# Patient Record
Sex: Male | Born: 1985 | Race: Black or African American | Hispanic: No | Marital: Single | Smoking: Current some day smoker
Health system: Southern US, Community
[De-identification: ages and names within clinical notes are randomized; demographics above are authoritative.]

## PROBLEM LIST (undated history)

## (undated) DIAGNOSIS — K259 Gastric ulcer, unspecified as acute or chronic, without hemorrhage or perforation: Secondary | ICD-10-CM

## (undated) HISTORY — PX: UPPER GI ENDOSCOPY: SHX6162

---
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
## 2009-03-26 ENCOUNTER — Emergency Department: Payer: Self-pay | Admitting: Emergency Medicine

## 2010-07-30 ENCOUNTER — Emergency Department: Payer: Self-pay | Admitting: Emergency Medicine

## 2010-09-02 ENCOUNTER — Emergency Department: Payer: Self-pay | Admitting: Emergency Medicine

## 2010-09-06 ENCOUNTER — Emergency Department: Payer: Self-pay | Admitting: Emergency Medicine

## 2010-09-07 ENCOUNTER — Emergency Department (HOSPITAL_COMMUNITY)
Admission: EM | Admit: 2010-09-07 | Discharge: 2010-09-07 | Disposition: A | Discharge status: Home / Self Care | Payer: Self-pay | Attending: Emergency Medicine | Admitting: Emergency Medicine

## 2010-09-07 ENCOUNTER — Emergency Department (HOSPITAL_COMMUNITY): Payer: Self-pay

## 2010-09-07 DIAGNOSIS — X58XXXA Exposure to other specified factors, initial encounter: Secondary | ICD-10-CM | POA: Insufficient documentation

## 2010-09-07 DIAGNOSIS — K219 Gastro-esophageal reflux disease without esophagitis: Secondary | ICD-10-CM | POA: Insufficient documentation

## 2010-09-07 DIAGNOSIS — T148XXA Other injury of unspecified body region, initial encounter: Secondary | ICD-10-CM | POA: Insufficient documentation

## 2010-09-07 DIAGNOSIS — M549 Dorsalgia, unspecified: Secondary | ICD-10-CM | POA: Insufficient documentation

## 2010-09-07 DIAGNOSIS — R0602 Shortness of breath: Secondary | ICD-10-CM | POA: Insufficient documentation

## 2010-09-07 DIAGNOSIS — R079 Chest pain, unspecified: Secondary | ICD-10-CM | POA: Insufficient documentation

## 2010-10-04 ENCOUNTER — Emergency Department (HOSPITAL_COMMUNITY): Payer: Self-pay

## 2010-10-04 ENCOUNTER — Emergency Department (HOSPITAL_COMMUNITY)
Admission: EM | Admit: 2010-10-04 | Discharge: 2010-10-04 | Disposition: A | Discharge status: Home / Self Care | Payer: Self-pay | Attending: Emergency Medicine | Admitting: Emergency Medicine

## 2010-10-04 ENCOUNTER — Emergency Department: Payer: Self-pay | Admitting: *Deleted

## 2010-10-04 DIAGNOSIS — R079 Chest pain, unspecified: Secondary | ICD-10-CM | POA: Insufficient documentation

## 2010-11-02 ENCOUNTER — Emergency Department: Payer: Self-pay | Admitting: Emergency Medicine

## 2010-11-23 ENCOUNTER — Emergency Department: Payer: Self-pay | Admitting: Emergency Medicine

## 2010-11-24 ENCOUNTER — Emergency Department: Payer: Self-pay | Admitting: Emergency Medicine

## 2011-02-28 ENCOUNTER — Emergency Department: Payer: Self-pay | Admitting: *Deleted

## 2011-09-16 ENCOUNTER — Emergency Department: Payer: Self-pay | Admitting: Emergency Medicine

## 2011-10-02 ENCOUNTER — Emergency Department: Payer: Self-pay | Admitting: Emergency Medicine

## 2011-10-02 LAB — COMPREHENSIVE METABOLIC PANEL
Albumin: 4 g/dL (ref 3.4–5.0)
Alkaline Phosphatase: 111 U/L (ref 50–136)
BUN: 10 mg/dL (ref 7–18)
Bilirubin,Total: 0.5 mg/dL (ref 0.2–1.0)
Calcium, Total: 8.9 mg/dL (ref 8.5–10.1)
Co2: 30 mmol/L (ref 21–32)
Creatinine: 1.11 mg/dL (ref 0.60–1.30)
Glucose: 87 mg/dL (ref 65–99)
Potassium: 3.7 mmol/L (ref 3.5–5.1)
Sodium: 143 mmol/L (ref 136–145)
Total Protein: 7.3 g/dL (ref 6.4–8.2)

## 2011-10-02 LAB — CBC
HCT: 44.3 % (ref 40.0–52.0)
HGB: 15.1 g/dL (ref 13.0–18.0)

## 2012-01-24 ENCOUNTER — Emergency Department: Payer: Self-pay | Admitting: Internal Medicine

## 2012-02-14 ENCOUNTER — Emergency Department: Payer: Self-pay | Admitting: Emergency Medicine

## 2012-03-08 ENCOUNTER — Emergency Department: Payer: Self-pay | Admitting: Emergency Medicine

## 2012-03-11 ENCOUNTER — Emergency Department: Payer: Self-pay | Admitting: Internal Medicine

## 2013-03-08 IMAGING — CR DG CHEST 2V
1 series · 2 of 2 positions shown · non-contrast
Comparison: none

REASON FOR EXAM: chest pain
COMMENTS:

PROCEDURE:     DXR - DXR CHEST PA (OR AP) AND LATERAL  - July 30, 2010  [DATE]
RESULT:     The lungs are clear. The heart and pulmonary vessels are normal.
The bony and mediastinal structures are unremarkable. There is no effusion.
There is no pneumothorax or evidence of congestive failure.

[Series 1: view not recorded · 0.17mm/px · 2 of 2 slices shown]
[im 1/2]
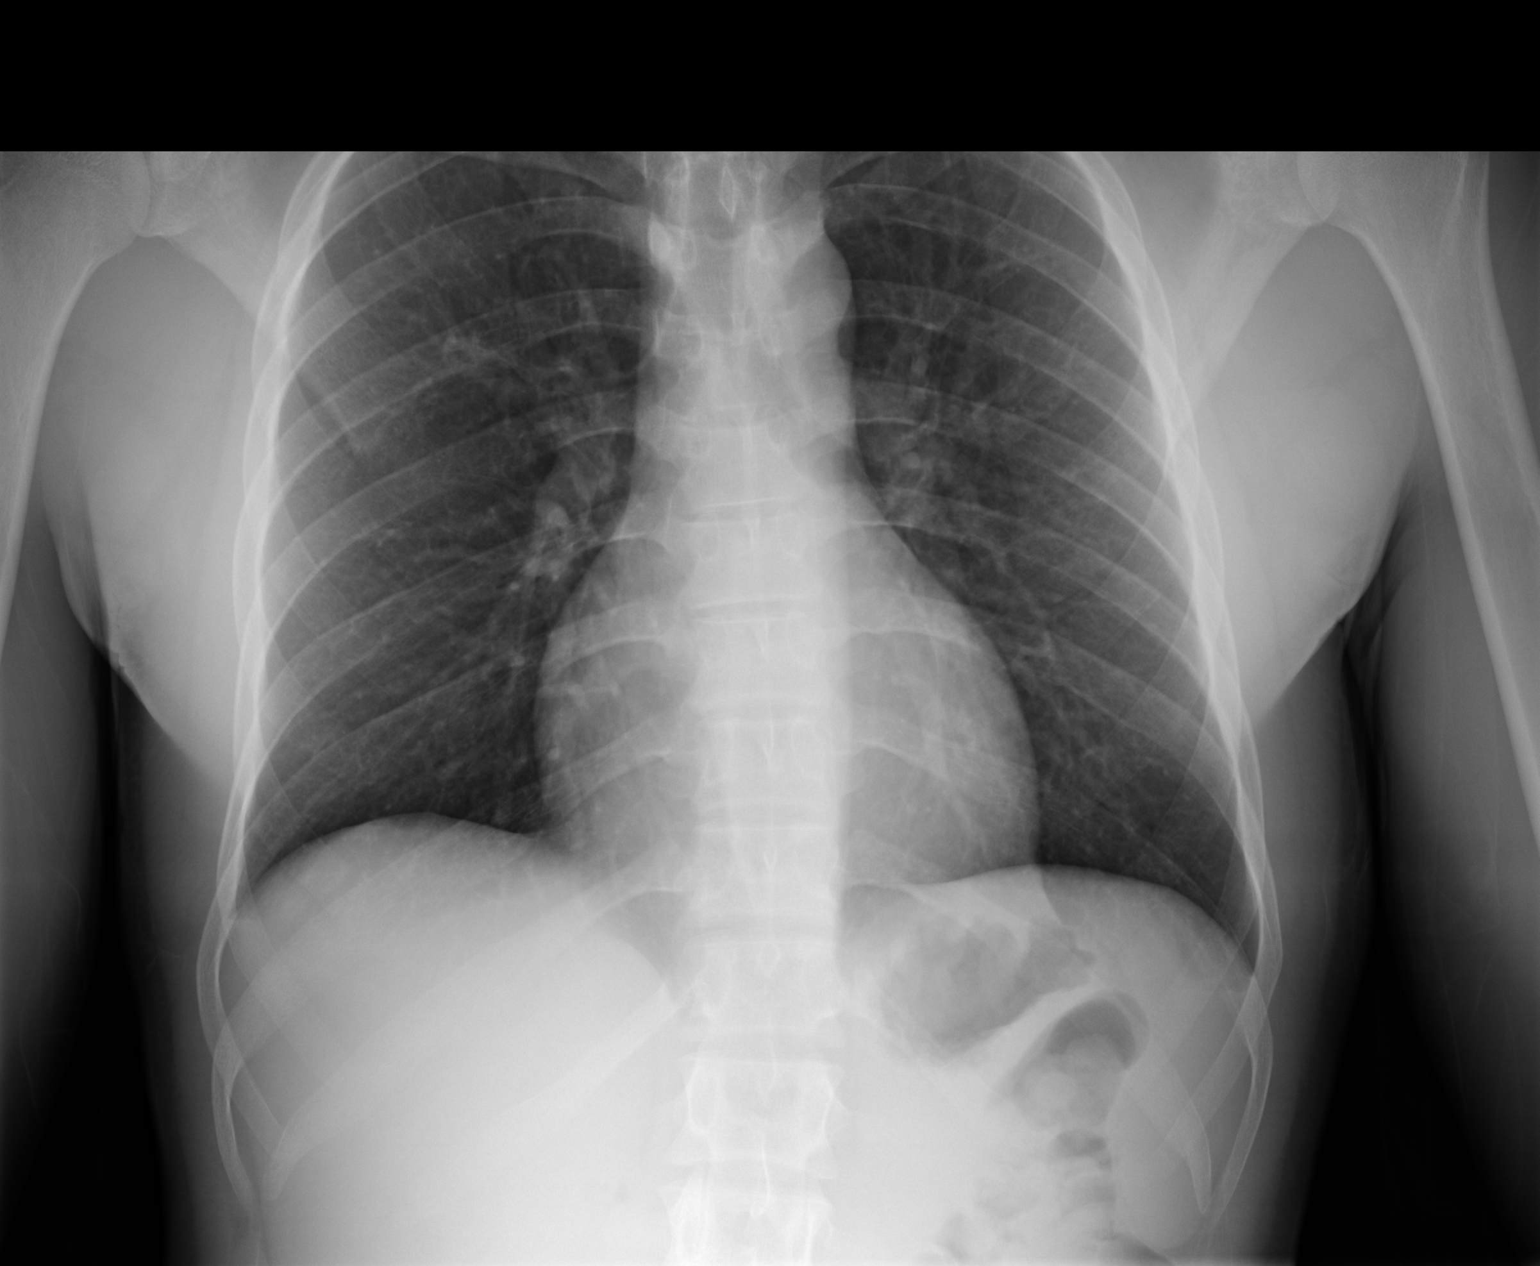
[im 2/2]
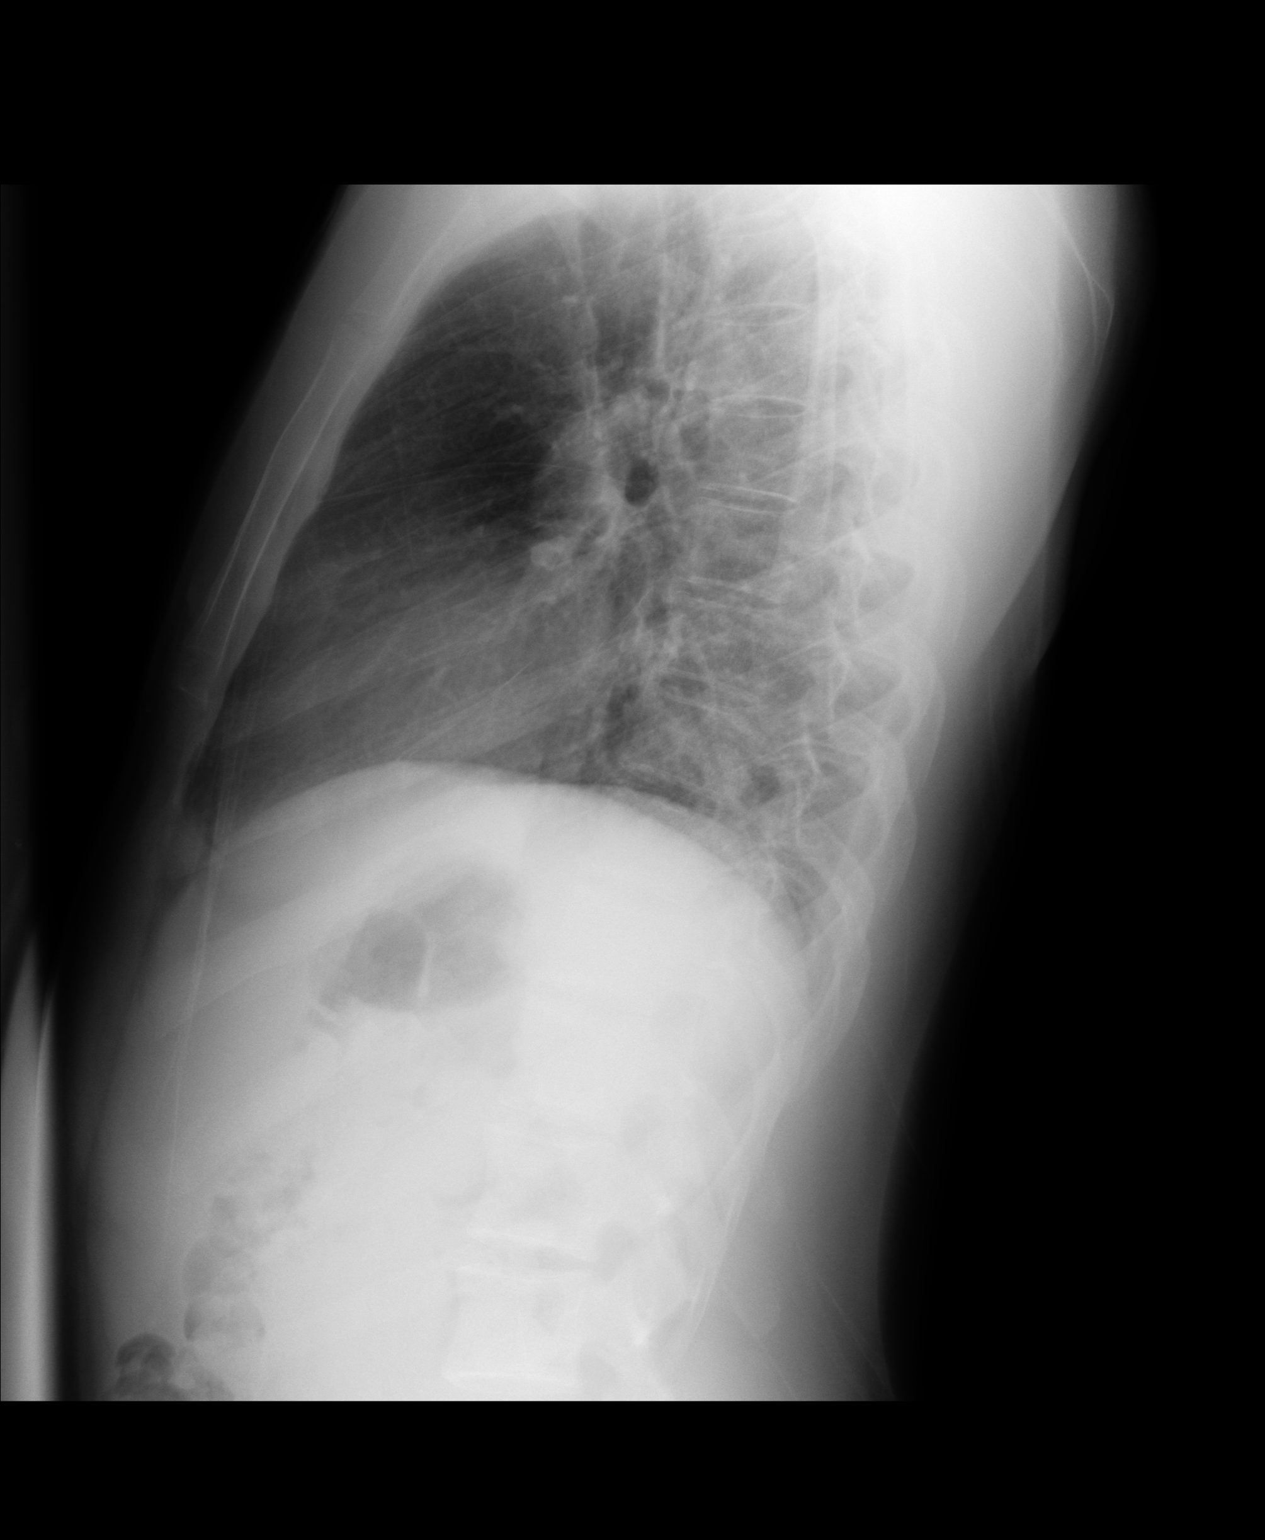

[2 of 2 positions shown; findings below may reference images not displayed]

IMPRESSION: No acute cardiopulmonary disease. Stable appearance.

## 2017-10-11 ENCOUNTER — Emergency Department
Admission: EM | Admit: 2017-10-11 | Discharge: 2017-10-11 | Disposition: A | Discharge status: Home / Self Care | Payer: BLUE CROSS/BLUE SHIELD | Attending: Emergency Medicine | Admitting: Emergency Medicine

## 2017-10-11 ENCOUNTER — Encounter: Payer: Self-pay | Admitting: Emergency Medicine

## 2017-10-11 DIAGNOSIS — L03311 Cellulitis of abdominal wall: Secondary | ICD-10-CM | POA: Insufficient documentation

## 2017-10-11 DIAGNOSIS — R21 Rash and other nonspecific skin eruption: Secondary | ICD-10-CM | POA: Diagnosis present

## 2017-10-11 MED ORDER — SULFAMETHOXAZOLE-TRIMETHOPRIM 800-160 MG PO TABS: 1.0000 | ORAL_TABLET | Freq: Two times a day (BID) | ORAL | 0 refills | Status: AC

## 2017-10-11 MED ORDER — SULFAMETHOXAZOLE-TRIMETHOPRIM 800-160 MG PO TABS
1.0000 | ORAL_TABLET | Freq: Once | ORAL | Status: AC
  Administered 2017-10-11: 1 via ORAL
  Filled 2017-10-11: qty 1

## 2017-10-11 NOTE — ED Notes (Signed)
Pt states that he believes something bit him on Wed evening on his lower part of stomach around his waist line. Pt states that it is painful, itchy and burning.

## 2017-10-11 NOTE — ED Triage Notes (Signed)
Pt arrives with complaints of rash on his lower right abdomen. Pt has two small open areas near belt line.

## 2017-10-11 NOTE — ED Provider Notes (Signed)
[facility] Emergency Department Provider Note  ____________________________________________  Time seen: Approximately 9:37 PM  I have reviewed the triage vital signs and the nursing notes.   HISTORY  Chief Complaint Rash    HPI Johnny MerlesGarrett Mcelmurry Jr. is a 32 y.o. male presents to the emergency department with a rash of the abdomen.  Patient reports that he has experienced symptoms for the past week.  He has noticed redness and pain where his belt usually sits.  He denies fever or chills.  Patient has been repeatedly touching the affected area.  No alleviating measures of been attempted.   History reviewed. No pertinent past medical history.  There are no active problems to display for this patient.   History reviewed. No pertinent surgical history.  Prior to Admission medications   Medication Sig Start Date End Date Taking? Authorizing Provider  sulfamethoxazole-trimethoprim (BACTRIM DS,SEPTRA DS) 800-160 MG tablet Take 1 tablet by mouth 2 (two) times daily for 7 days. 10/11/17 10/18/17  Orvil FeilWoods, Herman Fiero M, PA-C    Allergies Patient has no allergy information on record.  No family history on file.  Social History Social History   Tobacco Use  . Smoking status: Not on file  Substance Use Topics  . Alcohol use: Not on file  . Drug use: Not on file     Review of Systems  Constitutional: No fever/chills Eyes: No visual changes. No discharge ENT: No upper respiratory complaints. Cardiovascular: no chest pain. Respiratory: no cough. No SOB. Gastrointestinal: No abdominal pain.  No nausea, no vomiting.  No diarrhea.  No constipation. Musculoskeletal: Negative for musculoskeletal pain. Skin: Patient has approximately 2 cm region of circumferential cellulitis surrounding hair follicles. Neurological: Negative for headaches, focal weakness or numbness.  ____________________________________________   PHYSICAL EXAM:  VITAL SIGNS: ED Triage Vitals   Enc Vitals Group     BP 10/11/17 1952 (!) 144/89     Pulse Rate 10/11/17 1952 98     Resp 10/11/17 1952 18     Temp 10/11/17 1952 98.7 F (37.1 C)     Temp Source 10/11/17 1952 Oral     SpO2 10/11/17 1952 98 %     Weight 10/11/17 1952 188 lb (85.3 kg)     Height 10/11/17 1952 5\' 10"  (1.778 m)     Head Circumference --      Peak Flow --      Pain Score 10/11/17 1951 6     Pain Loc --      Pain Edu? --      Excl. in GC? --      Constitutional: Alert and oriented. Well appearing and in no acute distress. Eyes: Conjunctivae are normal. PERRL. EOMI. Head: Atraumatic. Cardiovascular: Normal rate, regular rhythm. Normal S1 and S2.  Good peripheral circulation. Respiratory: Normal respiratory effort without tachypnea or retractions. Lungs CTAB. Good air entry to the bases with no decreased or absent breath sounds. Musculoskeletal: Full range of motion to all extremities. No gross deformities appreciated. Neurologic:  Normal speech and language. No gross focal neurologic deficits are appreciated.  Skin: Patient has approximately 2 cm of circumferential cellulitis where patient's belt normally sits.  Induration palpable but no fluctuance. Psychiatric: Mood and affect are normal. Speech and behavior are normal. Patient exhibits appropriate insight and judgement.   ____________________________________________   LABS (all labs ordered are listed, but only abnormal results are displayed)  Labs Reviewed - No data to display ____________________________________________  EKG   ____________________________________________  RADIOLOGY  No results  found.  ____________________________________________    PROCEDURES  Procedure(s) performed:    Procedures    Medications  sulfamethoxazole-trimethoprim (BACTRIM DS,SEPTRA DS) 800-160 MG per tablet 1 tablet (1 tablet Oral Given 10/11/17 2133)     ____________________________________________   INITIAL IMPRESSION / ASSESSMENT  AND PLAN / ED COURSE  Pertinent labs & imaging results that were available during my care of the patient were reviewed by me and considered in my medical decision making (see chart for details).  Review of the [place] CSRS was performed in accordance of the [facility] prior to dispensing any controlled drugs.      Assessment and plan Cellulitis Patient presents to the emergency department with approximately 2 cm of circumferential cellulitis along the beltline.  No palpable fluctuation is appreciated on physical exam.  Incision and drainage is not warranted at this time.  Patient was treated empirically with Bactrim and advised to follow-up with primary care as needed.  All patient questions were answered.   ____________________________________________  FINAL CLINICAL IMPRESSION(S) / ED DIAGNOSES  Final diagnoses:  Abdominal wall cellulitis      NEW MEDICATIONS STARTED DURING THIS VISIT:  ED Discharge Orders         Ordered    sulfamethoxazole-trimethoprim (BACTRIM DS,SEPTRA DS) 800-160 MG tablet  2 times daily     10/11/17 2129              This chart was dictated using voice recognition software/Dragon. Despite best efforts to proofread, errors can occur which can change the meaning. Any change was purely unintentional.    Orvil FeilWoods, Mekiah Wahler M, PA-C 10/11/17 2140    Phineas SemenGoodman, Graydon, MD 10/11/17 2151

## 2020-10-03 ENCOUNTER — Other Ambulatory Visit: Payer: Self-pay

## 2020-10-03 ENCOUNTER — Emergency Department (HOSPITAL_COMMUNITY)
Admission: EM | Admit: 2020-10-03 | Discharge: 2020-10-03 | Disposition: A | Discharge status: Home / Self Care | Payer: BC Managed Care – PPO | Attending: Emergency Medicine | Admitting: Emergency Medicine

## 2020-10-03 ENCOUNTER — Encounter (HOSPITAL_COMMUNITY): Payer: Self-pay | Admitting: Emergency Medicine

## 2020-10-03 ENCOUNTER — Emergency Department (HOSPITAL_COMMUNITY): Payer: BC Managed Care – PPO

## 2020-10-03 DIAGNOSIS — R079 Chest pain, unspecified: Secondary | ICD-10-CM | POA: Diagnosis present

## 2020-10-03 HISTORY — DX: Gastric ulcer, unspecified as acute or chronic, without hemorrhage or perforation: K25.9

## 2020-10-03 LAB — BASIC METABOLIC PANEL
Anion gap: 3 — ABNORMAL LOW (ref 5–15)
BUN: 10 mg/dL (ref 6–20)
CO2: 28 mmol/L (ref 22–32)
Calcium: 9.1 mg/dL (ref 8.9–10.3)
Chloride: 107 mmol/L (ref 98–111)
Creatinine, Ser: 1.08 mg/dL (ref 0.61–1.24)
GFR, Estimated: >60 mL/min (ref >60)
Glucose, Bld: 91 mg/dL (ref 70–99)
Potassium: 3.9 mmol/L (ref 3.5–5.1)
Sodium: 138 mmol/L (ref 135–145)

## 2020-10-03 LAB — CBC
HCT: 44.2 % (ref 39.0–52.0)
Hemoglobin: 14.8 g/dL (ref 13.0–17.0)
MCH: 29.9 pg (ref 26.0–34.0)
MCHC: 33.5 g/dL (ref 30.0–36.0)
MCV: 89.3 fL (ref 80.0–100.0)
Platelets: 171 10*3/uL (ref 150–400)
RBC: 4.95 MIL/uL (ref 4.22–5.81)
RDW: 12.7 % (ref 11.5–15.5)
WBC: 4.1 10*3/uL (ref 4.0–10.5)
nRBC: 0 % (ref 0.0–0.2)

## 2020-10-03 LAB — TROPONIN I (HIGH SENSITIVITY)
Troponin I (High Sensitivity): 4 ng/L (ref <18)
Troponin I (High Sensitivity): 4 ng/L (ref <18)

## 2020-10-03 NOTE — ED Notes (Signed)
Patient asked this tech for a bandaid, this tech saw patient's IV was out and laying on his lap. This tech held pressure on IV site and cleaned the site. Patient no longer has an IV in left hand.

## 2020-10-03 NOTE — ED Provider Notes (Signed)
[facility] EMERGENCY DEPARTMENT Provider Note   CSN: [id number] Arrival date & time: 10/03/20  1417     History Chief Complaint  Patient presents with   Chest Pain    Ellington Greenslade. is a 35 y.o. male.  36 year old male brought in by EMS from work for pain of the chest.  Patient states around 2:00 today he was moving some light trays at work when he had a sudden pain in the center of his chest that "brought him to his knees."  Reports feeling short of breath, pain lasted for few seconds and then totally resolved.  Patient states that he had a similar episode yesterday.  Denies nausea, vomiting, diaphoresis, abdominal pain.  Denies history of hypertension, hyperlipidemia, diabetes or cardiac history.  No significant family cardiac history.  Is a non-smoker.      Past Medical History:  Diagnosis Date   Stomach ulcer     There are no problems to display for this patient.   No past surgical history on file.     No family history on file.     Home Medications Prior to Admission medications   Not on File    Allergies    Doxycycline  Review of Systems   Review of Systems  Constitutional:  Negative for chills, diaphoresis and fever.  Respiratory:  Positive for shortness of breath.   Cardiovascular:  Positive for chest pain. Negative for palpitations and leg swelling.  Gastrointestinal:  Negative for abdominal pain, constipation, diarrhea, nausea and vomiting.  Musculoskeletal:  Negative for arthralgias and myalgias.  Skin:  Negative for rash and wound.  Allergic/Immunologic: Negative for immunocompromised state.  Neurological:  Negative for weakness and numbness.  All other systems reviewed and are negative.  Physical Exam Updated Vital Signs BP (!) 130/93   Pulse (!) 52   Temp 98.2 F (36.8 C)   Resp 14   SpO2 100%   Physical Exam Vitals and nursing note reviewed.  Constitutional:      General: He is not in acute distress.     Appearance: He is well-developed. He is not diaphoretic.  HENT:     Head: Normocephalic and atraumatic.  Cardiovascular:     Rate and Rhythm: Normal rate and regular rhythm.     Heart sounds: Normal heart sounds.  Pulmonary:     Effort: Pulmonary effort is normal.     Breath sounds: Normal breath sounds.  Chest:     Chest wall: No tenderness.  Abdominal:     Palpations: Abdomen is soft.     Tenderness: There is no abdominal tenderness.  Musculoskeletal:     Cervical back: Neck supple.     Right lower leg: No tenderness. No edema.     Left lower leg: No tenderness. No edema.  Skin:    General: Skin is warm and dry.     Findings: No erythema or rash.  Neurological:     Mental Status: He is alert and oriented to person, place, and time.  Psychiatric:        Behavior: Behavior normal.    ED Results / Procedures / Treatments   Labs (all labs ordered are listed, but only abnormal results are displayed) Labs Reviewed  BASIC METABOLIC PANEL - Abnormal; Notable for the following components:      Result Value   Anion gap 3 (*)    All other components within normal limits  CBC  TROPONIN I (HIGH SENSITIVITY)  TROPONIN I (HIGH SENSITIVITY)  EKG None  Radiology DG Chest 2 View  Result Date: 10/03/2020 CLINICAL DATA:  Sudden onset chest pain since noon EXAM: CHEST - 2 VIEW COMPARISON:  02/28/2011 FINDINGS: Frontal and lateral views of the chest demonstrate an unremarkable cardiac silhouette. No airspace disease, effusion, or pneumothorax. No acute bony abnormalities. IMPRESSION: 1. No acute intrathoracic process. Electronically Signed   By: Sharlet Salina M.D.   On: 10/03/2020 18:09    Procedures Procedures   Medications Ordered in ED Medications - No data to display  ED Course  I have reviewed the triage vital signs and the nursing notes.  Pertinent labs & imaging results that were available during my care of the patient were reviewed by me and considered in my medical  decision making (see chart for details).  Clinical Course as of 10/03/20 2145  Thu Oct 03, 2020  [id number] 35 year old male with complaint of chest pain as above.  Exam is reassuring, lungs clear to auscultation, heart regular rate and rhythm, no chest wall tenderness, abdomen soft and nontender.  Extremity pulses equal. Chest x-ray is unremarkable, CBC and BMP without significant changes.  Troponin x2 are 4 and 4.  EKG without acute ischemic changes.  Vitals are reassuring with O2 sat of 99% on room air. Discussed results with patient, recommend recheck with PCP or follow-up with cardiology, return to the emergency room for worsening or concerning symptoms.  Case discussed with Dr. Deretha Emory, ER attending who agrees with plan of care. [LM]    Clinical Course User Index [LM] Alden Hipp   MDM Rules/Calculators/A&P                           Final Clinical Impression(s) / ED Diagnoses Final diagnoses:  Chest pain, unspecified type    Rx / DC Orders ED Discharge Orders     None        Alden Hipp 10/03/20 2145    Vanetta Mulders, MD 10/11/20 1539

## 2020-10-03 NOTE — ED Triage Notes (Addendum)
Patient BIB [facility] for sudden onset of chest pain at noon while at work. Received 324 mg aspirin and 1x SL NTG PTA from EMS. No chest pain on arrival to ED.

## 2020-10-03 NOTE — Discharge Instructions (Signed)
Your work-up today is reassuring.  Recommend follow-up with your primary care provider or see cardiology, referral given.  Return to the emergency room for worsening or concerning symptoms.

## 2020-10-03 NOTE — ED Provider Notes (Signed)
Emergency Medicine Provider Triage Evaluation Note  Johnny Summers. , a 35 y.o. male  was evaluated in triage.  Pt complains of chest pain, substernal, does not radiate, came on suddenly around 2 PM today, he works on the floor moving papers, became short of breath, fatigue and diaphoretic and had a drop to his knees.  Pain has since resolved, has no cardiac history, no history of PEs or DVTs, currently not on hormone therapy, denies any illicit drug use..  Review of Systems  Positive: Chest pain, fatigue Negative: Abdominal pain, nausea vomiting  Physical Exam  BP 113/80   Pulse (!) 59   Temp 98.2 F (36.8 C)   Resp 14   SpO2 99%  Gen:   Awake, no distress   Resp:  Normal effort  MSK:   Moves extremities without difficulty  Other:    Medical Decision Making  Medically screening exam initiated at 2:33 PM.  Appropriate orders placed.  Johnny Summers. was informed that the remainder of the evaluation will be completed by another provider, this initial triage assessment does not replace that evaluation, and the importance of remaining in the ED until their evaluation is complete.  Presents with chest pain, patient will need further work-up.   Carroll Sage, PA-C 10/03/20 1434    Horton, Clabe Seal, DO 10/04/20 [phone]

## 2020-10-07 ENCOUNTER — Ambulatory Visit: Payer: BC Managed Care – PPO | Admitting: Cardiology

## 2020-10-07 ENCOUNTER — Other Ambulatory Visit: Payer: Self-pay

## 2020-10-07 ENCOUNTER — Encounter: Payer: Self-pay | Admitting: Cardiology

## 2020-10-07 VITALS — BP 138/77 | HR 77 | Temp 98.3°F | Resp 16 | Ht 70.0 in | Wt 207.0 lb

## 2020-10-07 DIAGNOSIS — K219 Gastro-esophageal reflux disease without esophagitis: Secondary | ICD-10-CM

## 2020-10-07 DIAGNOSIS — R072 Precordial pain: Secondary | ICD-10-CM

## 2020-10-07 DIAGNOSIS — R079 Chest pain, unspecified: Secondary | ICD-10-CM

## 2020-10-07 MED ORDER — OMEPRAZOLE 20 MG PO CPDR: 20.0000 mg | DELAYED_RELEASE_CAPSULE | Freq: Every day | ORAL | 11 refills | Status: AC

## 2020-10-07 NOTE — Progress Notes (Signed)
Primary Physician/Referring:  Patient, No Pcp Per (Inactive)  Patient ID: Johnny Summers., male    DOB: 1985-10-16, 35 y.o.   MRN: [id number]  No chief complaint on file.  HPI:    Johnny Summers.  is a 35 y.o. AA male who presents to our office for evaluation of chest pain.  Patient was seen in [facility] emergency department 10/03/2020 following episodes of sharp chest pain lasting several seconds.  Evaluation in the emergency department revealed BMP, CBC within normal limits and high-sensitivity troponin negative x2.  Chest x-ray at that time was unremarkable and EKG revealed sinus bradycardia without acute changes.   Patient denies history of hypertension, hyperlipidemia, diabetes, family history of premature CAD.  He smokes 30 pack/day for the last 15 years.  Patient does have history of nonbleeding gastric ulcer in 2012.  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  He states he was at work on 10/03/2020 when he had sudden onset sharp substernal chest pain lasting several seconds and worse with deep breaths.  Patient was then resting in the break room and felt better, however when he stood up he felt sharp substernal pain again.  Over the last 4 days patient has also noticed pain across his chest particularly in the mornings.  Patient does note that he did jumping jacks this morning, which seem to trigger chest pain which resolved when stopping the jumping jacks.  Denies palpitations, syncope, near syncope, dizziness.  Notably patient does not have a PCP who he follows with.  Past Medical History:  Diagnosis Date   Stomach ulcer    Past Surgical History:  Procedure Laterality Date   UPPER GI ENDOSCOPY     Family History  Problem Relation Age of Onset   Cancer Father     Social History   Tobacco Use   Smoking status: Some Days    Packs/day: 0.25    Years: 15.00    Pack years: 3.75    Types: Cigarettes   Smokeless tobacco: Never  Substance Use  Topics   Alcohol use: Not Currently    Alcohol/week: 1.0 standard drink    Types: 1 Shots of liquor per week    Comment: occasionally   Marital Status: Single   ROS  Review of Systems  Constitutional: Negative for malaise/fatigue.  Cardiovascular:  Negative for chest pain, claudication, leg swelling, near-syncope, orthopnea, palpitations, paroxysmal nocturnal dyspnea and syncope.  Respiratory:  Negative for shortness of breath.   Neurological:  Negative for dizziness.   Objective  Blood pressure 138/77, pulse 77, temperature 98.3 F (36.8 C), temperature source Temporal, resp. rate 16, height 5\' 10"  (1.778 m), weight 207 lb (93.9 kg), SpO2 97 %.  Vitals with BMI 10/07/2020 10/03/2020 10/03/2020  Height 5\' 10"  - -  Weight 207 lbs - -  BMI 29.7 - -  Systolic 138 130 12/03/2020  Diastolic 77 93 96  Pulse 77 52 52      Physical Exam Vitals reviewed.  Constitutional:      Appearance: He is obese.  HENT:     Head: Normocephalic and atraumatic.  Cardiovascular:     Rate and Rhythm: Normal rate and regular rhythm.     Pulses: Intact distal pulses.          Carotid pulses are 2+ on the right side and 2+ on the left side.      Radial pulses are 2+ on the right side and 2+ on the left side.  Femoral pulses are 2+ on the right side and 2+ on the left side.      Popliteal pulses are 2+ on the right side and 2+ on the left side.       Dorsalis pedis pulses are 2+ on the right side and 2+ on the left side.       Posterior tibial pulses are 2+ on the right side and 2+ on the left side.     Heart sounds: S1 normal and S2 normal. No murmur heard.   No gallop.  Pulmonary:     Effort: Pulmonary effort is normal. No respiratory distress.     Breath sounds: No wheezing, rhonchi or rales.  Chest:    Musculoskeletal:     Right lower leg: No edema.     Left lower leg: No edema.  Neurological:     Mental Status: He is alert.    Laboratory examination:   Recent Labs    10/03/20 1433  NA  138  K 3.9  CL 107  CO2 28  GLUCOSE 91  BUN 10  CREATININE 1.08  CALCIUM 9.1  GFRNONAA >60   estimated creatinine clearance is 112 mL/min (by C-G formula based on SCr of 1.08 mg/dL).  CMP Latest Ref Rng & Units 10/03/2020 10/02/2011  Glucose 70 - 99 mg/dL 91 87  BUN 6 - 20 mg/dL 10 10  Creatinine 6.60 - 1.24 mg/dL 6.30 1.60  Sodium 109 - 145 mmol/L 138 143  Potassium 3.5 - 5.1 mmol/L 3.9 3.7  Chloride 98 - 111 mmol/L 107 105  CO2 22 - 32 mmol/L 28 30  Calcium 8.9 - 10.3 mg/dL 9.1 8.9  Total Protein 6.4 - 8.2 g/dL - 7.3  Total Bilirubin 0.2 - 1.0 mg/dL - 0.5  Alkaline Phos 50 - 136 Unit/L - 111  AST 15 - 37 Unit/L - 23  ALT U/L - 20   CBC Latest Ref Rng & Units 10/03/2020 10/02/2011  WBC 4.0 - 10.5 K/uL 4.1 4.3  Hemoglobin 13.0 - 17.0 g/dL 32.3 55.7  Hematocrit 32.2 - 52.0 % 44.2 44.3  Platelets 150 - 400 K/uL 171 163    Lipid Panel No results for input(s): CHOL, TRIG, LDLCALC, VLDL, HDL, CHOLHDL, LDLDIRECT in the last 8760 hours.  HEMOGLOBIN A1C No results found for: HGBA1C, MPG TSH No results for input(s): TSH in the last 8760 hours.  Sensitivity troponin 10/04/2018: 4--> 4  External labs:   None   Allergies   Allergies  Allergen Reactions   Doxycycline       Medications Prior to Visit:   Outpatient Medications Prior to Visit  Medication Sig Dispense Refill   Acetaminophen 325 MG CAPS Take 2 tablets by mouth daily.     No facility-administered medications prior to visit.     Final Medications at End of Visit    Current Meds  Medication Sig   Acetaminophen 325 MG CAPS Take 2 tablets by mouth daily.   omeprazole (PRILOSEC) 20 MG capsule Take 1 capsule (20 mg total) by mouth daily.     Radiology:   No results found.  Cardiac Studies:   Chest x-ray 10/03/2020: Frontal and lateral views of the chest demonstrate an unremarkable cardiac silhouette. No airspace disease, effusion, or pneumothorax. No acute bony abnormalities. IMPRESSION: No acute  intrathoracic process.  EKG:   10/07/2020: Sinus rhythm with short PR interval at rate of 63 bpm, incompleteright bundle branch block. Otherwise normal EKG.  10/03/2020: Sinus bradycardia at a rate of 57  bpm.  Normal axis.  No evidence of ischemia or underlying injury pattern.  Assessment     ICD-10-CM   1. Chest pain of uncertain etiology  R07.9 EKG 12-Lead    2. Precordial pain  R07.2 Lipid Panel With LDL/HDL Ratio    TSH    Hgb A1c w/o eAG    PCV CARDIAC STRESS TEST    3. Gastroesophageal reflux disease without esophagitis  K21.9        There are no discontinued medications.  Meds ordered this encounter  Medications   omeprazole (PRILOSEC) 20 MG capsule    Sig: Take 1 capsule (20 mg total) by mouth daily.    Dispense:  30 capsule    Refill:  11    Recommendations:   Johnny Brodersen. is a 35 y.o. AA male with history of active tobacco use, gastric ulcer and 2012.  Denies history of hypertension, hyperlipidemia, diabetes, family history of premature CAD.  Who presents for evaluation of chest pain after being seen in emergency department 10/03/2020.  Patient's symptoms that occurred on 10/03/2020 are highly suggestive of musculoskeletal etiology.  He also has symptoms consistent with GERD.  We will empirically treat him with omeprazole 20 mg daily.  Patient has relatively low risk of cardiovascular disease, with his only significant risk factor being tobacco use.  Low suspicion patient has underlying CAD, however given ongoing episodes of chest pain will obtain exercise stress test.  We will also order TSH, lipid profile testing, and A1c to further restratification, particularly as he does not have a PCP.  Patient was counseled regarding diet and lifestyle modifications as well as 5 minutes of today's visit were spent counseling regarding smoking cessation.  Patient verbalized understanding of the importance, however he does not appear motivated to make diet and lifestyle modifications  for quit smoking.  Notably patient's blood pressure is elevated on today's office visit, could consider addition of antihypertensive medications in the future.  However we will hold off at this time as this is an isolated elevated blood pressure reading.  Further recommendations pending results of additional testing.  Patient was seen in collaboration with Dr. Jacinto Halim. He also reviewed patient's chart and examined the patient. Dr. Jacinto Halim is in agreement of the plan.    Rayford Halsted, PA-C 10/07/2020, 4:23 PM Office: [phone]

## 2020-10-07 NOTE — Patient Instructions (Signed)
Gastroesophageal Reflux Disease, Adult Gastroesophageal reflux (GER) happens when acid from the stomach flows up into the tube that connects the mouth and the stomach (esophagus). Normally, food travels down the esophagus and stays in the stomach to be digested. With GER, food and stomach acid sometimes move back up into the esophagus. You may have a disease called gastroesophageal reflux disease (GERD) if the reflux: Happens often. Causes frequent or very bad symptoms. Causes problems such as damage to the esophagus. When this happens, the esophagus becomes sore and swollen. Over time, GERD can make small holes (ulcers) in the lining of the esophagus. What are the causes? This condition is caused by a problem with the muscle between the esophagus and the stomach. When this muscle is weak or not normal, it does not close properly to keep food and acid from coming back up from the stomach. The muscle can be weak because of: Tobacco use. Pregnancy. Having a certain type of hernia (hiatal hernia). Alcohol use. Certain foods and drinks, such as coffee, chocolate, onions, and peppermint. What increases the risk? Being overweight. Having a disease that affects your connective tissue. Taking NSAIDs, such a ibuprofen. What are the signs or symptoms? Heartburn. Difficult or painful swallowing. The feeling of having a lump in the throat. A bitter taste in the mouth. Bad breath. Having a lot of saliva. Having an upset or bloated stomach. Burping. Chest pain. Different conditions can cause chest pain. Make sure you see your doctor if you have chest pain. Shortness of breath or wheezing. A long-term cough or a cough at night. Wearing away of the surface of teeth (tooth enamel). Weight loss. How is this treated? Making changes to your diet. Taking medicine. Having surgery. Treatment will depend on how bad your symptoms are. Follow these instructions at home: Eating and drinking  Follow a  diet as told by your doctor. You may need to avoid foods and drinks such as: Coffee and tea, with or without caffeine. Drinks that contain alcohol. Energy drinks and sports drinks. Bubbly (carbonated) drinks or sodas. Chocolate and cocoa. Peppermint and mint flavorings. Garlic and onions. Horseradish. Spicy and acidic foods. These include peppers, chili powder, curry powder, vinegar, hot sauces, and BBQ sauce. Citrus fruit juices and citrus fruits, such as oranges, lemons, and limes. Tomato-based foods. These include red sauce, chili, salsa, and pizza with red sauce. Fried and fatty foods. These include donuts, french fries, potato chips, and high-fat dressings. High-fat meats. These include hot dogs, rib eye steak, sausage, ham, and bacon. High-fat dairy items, such as whole milk, butter, and cream cheese. Eat small meals often. Avoid eating large meals. Avoid drinking large amounts of liquid with your meals. Avoid eating meals during the 2-3 hours before bedtime. Avoid lying down right after you eat. Do not exercise right after you eat. Lifestyle  Do not smoke or use any products that contain nicotine or tobacco. If you need help quitting, ask your doctor. Try to lower your stress. If you need help doing this, ask your doctor. If you are overweight, lose an amount of weight that is healthy for you. Ask your doctor about a safe weight loss goal. General instructions Pay attention to any changes in your symptoms. Take over-the-counter and prescription medicines only as told by your doctor. Do not take aspirin, ibuprofen, or other NSAIDs unless your doctor says it is okay. Wear loose clothes. Do not wear anything tight around your waist. Raise (elevate) the head of your bed about 6   inches (15 cm). You may need to use a wedge to do this. Avoid bending over if this makes your symptoms worse. Keep all follow-up visits. Contact a doctor if: You have new symptoms. You lose weight and you  do not know why. You have trouble swallowing or it hurts to swallow. You have wheezing or a cough that keeps happening. You have a hoarse voice. Your symptoms do not get better with treatment. Get help right away if: You have sudden pain in your arms, neck, jaw, teeth, or back. You suddenly feel sweaty, dizzy, or light-headed. You have chest pain or shortness of breath. You vomit and the vomit is green, yellow, or black, or it looks like blood or coffee grounds. You faint. Your poop (stool) is red, bloody, or black. You cannot swallow, drink, or eat. These symptoms may represent a serious problem that is an emergency. Do not wait to see if the symptoms will go away. Get medical help right away. Call your local emergency services (911 in the [place] Do not drive yourself to the hospital. Summary If a person has gastroesophageal reflux disease (GERD), food and stomach acid move back up into the esophagus and cause symptoms or problems such as damage to the esophagus. Treatment will depend on how bad your symptoms are. Follow a diet as told by your doctor. Take all medicines only as told by your doctor. This information is not intended to replace advice given to you by your health care provider. Make sure you discuss any questions you have with your health care provider. Document Revised: 08/28/2019 Document Reviewed: 08/28/2019 Elsevier Patient Education  2022 [employer]  

## 2020-10-08 ENCOUNTER — Telehealth: Payer: Self-pay

## 2020-10-08 ENCOUNTER — Telehealth: Payer: Self-pay | Admitting: Student

## 2020-10-08 NOTE — Telephone Encounter (Signed)
Patient called and stated that he was having some back pain after starting the omeprazole that you just prescribed yesterday. He wants to know if this is a common side effect. Please advise.

## 2020-10-08 NOTE — Telephone Encounter (Signed)
Pt called w/questions regarding omeprazole. Wanted to discuss w/medical assistant. Please call back when possible.

## 2020-10-08 NOTE — Telephone Encounter (Signed)
Attempted to call pt, no answer. Left vm requesting call back.

## 2020-10-08 NOTE — Telephone Encounter (Signed)
Pt called regarding back pain with omeprazole.

## 2020-10-08 NOTE — Telephone Encounter (Signed)
Message already sent to provider

## 2020-10-09 NOTE — Telephone Encounter (Signed)
Pt notes that he was having complains of back pain over the past couple of days. Wasn't sure if it was related to recent omeprazole use or other ancillary causes. Reports that pain localized in the upper back that is radiating to the flanks. Pt denies any other pain or discomfort. Recommended pt try OTC tylenol to see if it helps with his pain symptoms. Pt to call the office if symptoms continue to persist or worsen.

## 2020-10-11 ENCOUNTER — Ambulatory Visit: Payer: Self-pay | Admitting: Cardiology

## 2020-10-15 ENCOUNTER — Telehealth: Payer: Self-pay

## 2020-10-15 NOTE — Telephone Encounter (Signed)
Spoke with patient at length, symptoms remain consistent with musculoskeletal etiology advised him to continue taking Tylenol.  We will reevaluate symptoms at follow-up visit in 1 month.

## 2020-10-18 LAB — LIPID PANEL WITH LDL/HDL RATIO
Cholesterol, Total: 178 mg/dL (ref 100–199)
HDL: 45 mg/dL (ref >39)
LDL Chol Calc (NIH): 117 mg/dL — ABNORMAL HIGH (ref 0–99)
LDL/HDL Ratio: 2.6 ratio (ref 0.0–3.6)
Triglycerides: 87 mg/dL (ref 0–149)
VLDL Cholesterol Cal: 16 mg/dL (ref 5–40)

## 2020-10-18 LAB — HGB A1C W/O EAG: Hgb A1c MFr Bld: 5.6 % (ref 4.8–5.6)

## 2020-10-18 LAB — TSH: TSH: 1.1 u[IU]/mL (ref 0.450–4.500)

## 2020-10-18 NOTE — Progress Notes (Signed)
Lipids uncontrolled, will discuss further at upcoming office visit.

## 2020-10-22 ENCOUNTER — Ambulatory Visit: Payer: BC Managed Care – PPO

## 2020-10-22 ENCOUNTER — Other Ambulatory Visit: Payer: Self-pay

## 2020-10-22 DIAGNOSIS — R072 Precordial pain: Secondary | ICD-10-CM

## 2020-10-28 NOTE — Progress Notes (Signed)
Intermediate risk stress test, will discuss further at upcoming office visit.

## 2020-10-29 NOTE — Progress Notes (Signed)
Patient called back, he understands that results will be discussed in full detail at his next OV.

## 2020-10-29 NOTE — Progress Notes (Signed)
Called patient, HOME number is diconnected, CELL number, NA, VM-box is full, cannot leave a message.

## 2020-11-05 ENCOUNTER — Encounter: Payer: Self-pay | Admitting: Student

## 2020-11-05 NOTE — Progress Notes (Signed)
Patient called on call service reportedly to discuss changing his medication according to note from calling service. I attempted twice to return the patient's call, however he was unavailable and his voicemail box was full so I was unable to leave a message. Will plan to attempt to recall the patient tomorrow.

## 2020-11-06 ENCOUNTER — Telehealth: Payer: Self-pay | Admitting: Student

## 2020-11-06 NOTE — Telephone Encounter (Signed)
Pt says he spoke with a med assistant yesterday about having chest pain. If anyone remembers speaking with him about this, please call him.

## 2020-11-06 NOTE — Telephone Encounter (Signed)
Did you ever speak to him?

## 2020-11-06 NOTE — Telephone Encounter (Signed)
No I did not get ahold of him yet. Please give him a call and get some more information. What are his concerns? Sounds like he may need appt. I dont have any pts Friday, I can se him then.

## 2020-11-07 NOTE — Progress Notes (Signed)
Primary Physician/Referring:  Patient, No Pcp Per (Inactive)  Patient ID: Arvella Merles., male    DOB: Oct 07, 1985, 35 y.o.   MRN: [id number]  Chief Complaint  Patient presents with   Chest Pain   Follow-up   Results    HPI:    Alen Matheson.  is a 35 y.o. AA male who presents to our office for evaluation of chest pain.  Patient was seen in [facility] emergency department 10/03/2020 following episodes of sharp chest pain lasting several seconds.  Evaluation in the emergency department revealed BMP, CBC within normal limits and high-sensitivity troponin negative x2.  Chest x-ray at that time was unremarkable and EKG revealed sinus bradycardia without acute changes.   Patient denies history of hypertension, hyperlipidemia, diabetes, family history of premature CAD.  He smokes 1/3 pack/day for the last 15 years.  Patient does have history of nonbleeding gastric ulcer in 2012.  Patient was last seen in our office 10/07/2020 with symptoms suggestive of GERD as well as costochondritis.  At last office visit patient was started on omeprazole and advised to take Tylenol for musculoskeletal chest pain given history of gastric ulcer.  Although suspicion was low did obtain exercise stress test as well as lab testing.  A1c and TSH within normal limits.  Patient's LDL elevated at 117.  Patient's exercise treadmill stress test was intermediate risk due to poor functional capacity and PVCs/ventricular couplets during stress.  Patient now presents for follow up.  Patient has discontinued Tylenol and is only taking omeprazole for the last 2 days.  He continues to complain of chest discomfort when he raises his arms as well as when lying flat.  Denies palpitations, syncope, near syncope, dizziness.  Patient has still not established with PCP.  Past Medical History:  Diagnosis Date   Stomach ulcer    Past Surgical History:  Procedure Laterality Date   UPPER GI ENDOSCOPY     Family History  Problem  Relation Age of Onset   Cancer Father     Social History   Tobacco Use   Smoking status: Some Days    Packs/day: 0.25    Years: 15.00    Pack years: 3.75    Types: Cigarettes   Smokeless tobacco: Never  Substance Use Topics   Alcohol use: Not Currently    Alcohol/week: 1.0 standard drink    Types: 1 Shots of liquor per week    Comment: occasionally   Marital Status: Single   ROS  Review of Systems  Constitutional: Negative for malaise/fatigue.  Cardiovascular:  Positive for chest pain. Negative for claudication, leg swelling, near-syncope, orthopnea, palpitations, paroxysmal nocturnal dyspnea and syncope.  Respiratory:  Negative for shortness of breath.   Neurological:  Negative for dizziness.   Objective  Blood pressure 122/79, pulse 81, temperature 98 F (36.7 C), temperature source Temporal, resp. rate 16, height 5\' 10"  (1.778 m), weight 205 lb 6.4 oz (93.2 kg), SpO2 96 %.  Vitals with BMI 11/08/2020 10/07/2020 10/03/2020  Height 5\' 10"  5\' 10"  -  Weight 205 lbs 6 oz 207 lbs -  BMI 29.47 29.7 -  Systolic 122 138 12/03/2020  Diastolic 79 77 93  Pulse 81 77 52      Physical Exam Vitals reviewed.  Constitutional:      Appearance: He is obese.  HENT:     Head: Normocephalic and atraumatic.  Cardiovascular:     Rate and Rhythm: Normal rate and regular rhythm.     Pulses: Intact  distal pulses.          Carotid pulses are 2+ on the right side and 2+ on the left side.      Radial pulses are 2+ on the right side and 2+ on the left side.       Femoral pulses are 2+ on the right side and 2+ on the left side.      Popliteal pulses are 2+ on the right side and 2+ on the left side.       Dorsalis pedis pulses are 2+ on the right side and 2+ on the left side.       Posterior tibial pulses are 2+ on the right side and 2+ on the left side.     Heart sounds: S1 normal and S2 normal. No murmur heard.   No gallop.  Pulmonary:     Effort: Pulmonary effort is normal.     Breath sounds:  Normal breath sounds.  Chest:    Musculoskeletal:     Right lower leg: No edema.     Left lower leg: No edema.  Neurological:     Mental Status: He is alert.    Laboratory examination:   Recent Labs    10/03/20 1433  NA 138  K 3.9  CL 107  CO2 28  GLUCOSE 91  BUN 10  CREATININE 1.08  CALCIUM 9.1  GFRNONAA >60   CrCl cannot be calculated (Patient's most recent lab result is older than the maximum 21 days allowed.).  CMP Latest Ref Rng & Units 10/03/2020 10/02/2011  Glucose 70 - 99 mg/dL 91 87  BUN 6 - 20 mg/dL 10 10  Creatinine 2.69 - 1.24 mg/dL 4.85 4.62  Sodium 703 - 145 mmol/L 138 143  Potassium 3.5 - 5.1 mmol/L 3.9 3.7  Chloride 98 - 111 mmol/L 107 105  CO2 22 - 32 mmol/L 28 30  Calcium 8.9 - 10.3 mg/dL 9.1 8.9  Total Protein 6.4 - 8.2 g/dL - 7.3  Total Bilirubin 0.2 - 1.0 mg/dL - 0.5  Alkaline Phos 50 - 136 Unit/L - 111  AST 15 - 37 Unit/L - 23  ALT U/L - 20   CBC Latest Ref Rng & Units 10/03/2020 10/02/2011  WBC 4.0 - 10.5 K/uL 4.1 4.3  Hemoglobin 13.0 - 17.0 g/dL 50.0 93.8  Hematocrit 18.2 - 52.0 % 44.2 44.3  Platelets 150 - 400 K/uL 171 163    Lipid Panel Recent Labs    10/17/20 1235  CHOL 178  TRIG 87  LDLCALC 117*  HDL 45    HEMOGLOBIN A1C Lab Results  Component Value Date   HGBA1C 5.6 10/17/2020   TSH Recent Labs    10/17/20 1235  TSH 1.100    Sensitivity troponin 10/04/2018: 4--> 4  External labs:   None   Allergies   Allergies  Allergen Reactions   Doxycycline     Medications Prior to Visit:   Outpatient Medications Prior to Visit  Medication Sig Dispense Refill   naproxen sodium (ALEVE) 220 MG tablet Take 220 mg by mouth.     omeprazole (PRILOSEC) 20 MG capsule Take 1 capsule (20 mg total) by mouth daily. 30 capsule 11   Acetaminophen 325 MG CAPS Take 2 tablets by mouth daily.     No facility-administered medications prior to visit.   Final Medications at End of Visit    Current Meds  Medication Sig   naproxen sodium  (ALEVE) 220 MG tablet Take 220 mg by mouth.  omeprazole (PRILOSEC) 20 MG capsule Take 1 capsule (20 mg total) by mouth daily.   Radiology:   No results found. Chest x-ray 10/03/2020: Frontal and lateral views of the chest demonstrate an unremarkable cardiac silhouette. No airspace disease, effusion, or pneumothorax. No acute bony abnormalities. IMPRESSION: No acute intrathoracic process.  Cardiac Studies:   [facility] CARDIAC STRESS TEST 10/22/2020 Functional status: Poor. Chest pain: No. Reason for stopping exercise: Fatigue/weakness. Hypertensive response to exercise: No. Exercise time 7 minutes 18 seconds on Bruce protocol, achieved 9.06METS, 94% of age-predicted maximum heart rate. Stress ECG negative for ischemia. Intermediate risk study - Poor functional capacity for age and PVCs / ventricular couplets noted during stress. Clinical correlation recommended.  EKG:   10/07/2020: Sinus rhythm with short PR interval at rate of 63 bpm, incompleteright bundle branch block. Otherwise normal EKG.  10/03/2020: Sinus bradycardia at a rate of 57 bpm.  Normal axis.  No evidence of ischemia or underlying injury pattern.  Assessment     ICD-10-CM   1. Precordial pain  R07.2 CT CORONARY MORPH W/CTA COR W/SCORE W/CA W/CM &/OR WO/CM    2. Nicotine dependence with current use  F17.200 CT CORONARY MORPH W/CTA COR W/SCORE W/CA W/CM &/OR WO/CM    3. Hypercholesterolemia  E78.00 CT CORONARY MORPH W/CTA COR W/SCORE W/CA W/CM &/OR WO/CM    4. Gastroesophageal reflux disease without esophagitis  K21.9        Medications Discontinued During This Encounter  Medication Reason   Acetaminophen 325 MG CAPS Error    No orders of the defined types were placed in this encounter.   Recommendations:   Courtez Twaddle. is a 35 y.o. AA male with history of active tobacco use, gastric ulcer and 2012.  Denies history of hypertension, hyperlipidemia, diabetes, family history of premature CAD.  Who presents for  evaluation of chest pain after being seen in emergency department 10/03/2020.  Patient was last seen in our office 10/07/2020 with symptoms suggestive of GERD as well as costochondritis.  At last office visit patient was started on omeprazole and advised to take Tylenol for musculoskeletal chest pain given history of gastric ulcer.  Although suspicion was low did obtain exercise stress test as well as lab testing.  A1c and TSH within normal limits.  Patient's LDL elevated at 117.  Patient's exercise treadmill stress test was intermediate risk due to poor functional capacity and PVCs/ventricular couplets during stress.  Patient now presents for follow up.  Patient continues to have chest pain and symptoms consistent with musculoskeletal etiology as well as GERD.  However reviewed and discussed with patient regarding results of exercise stress test.  Stress test revealed poor functional capacity and frequent utricular ectopy during stress.  Given stress test findings and risk factors including tobacco use and hyperlipidemia, as well as patient's continued chest pain discussed with patient option of further ischemic evaluation.  Shared decision was to proceed with coronary CTA.  Otherwise advised patient to continue omeprazole and Tylenol, patient has also tried naproxen.  Advised him to take naproxen with food if he chooses to take it, given history of gastric ulcer.  Also discussed at length with patient regarding the importance of diet and lifestyle modifications.  Advised him of the importance of establishing with PCP.  Follow-up in 6 to 8 weeks, sooner if needed, for precordial pain and results of cardiac testing.   Rayford Halsted, PA-C 11/08/2020, 12:06 PM Office: [phone]

## 2020-11-08 ENCOUNTER — Ambulatory Visit: Payer: BC Managed Care – PPO | Admitting: Student

## 2020-11-08 ENCOUNTER — Encounter: Payer: Self-pay | Admitting: Student

## 2020-11-08 ENCOUNTER — Telehealth: Payer: Self-pay | Admitting: Student

## 2020-11-08 ENCOUNTER — Other Ambulatory Visit: Payer: Self-pay

## 2020-11-08 VITALS — BP 122/79 | HR 81 | Temp 98.0°F | Resp 16 | Ht 70.0 in | Wt 205.4 lb

## 2020-11-08 DIAGNOSIS — R072 Precordial pain: Secondary | ICD-10-CM

## 2020-11-08 DIAGNOSIS — E7800 Pure hypercholesterolemia, unspecified: Secondary | ICD-10-CM

## 2020-11-08 DIAGNOSIS — F17200 Nicotine dependence, unspecified, uncomplicated: Secondary | ICD-10-CM

## 2020-11-08 DIAGNOSIS — K259 Gastric ulcer, unspecified as acute or chronic, without hemorrhage or perforation: Secondary | ICD-10-CM | POA: Insufficient documentation

## 2020-11-08 DIAGNOSIS — K219 Gastro-esophageal reflux disease without esophagitis: Secondary | ICD-10-CM

## 2020-11-08 NOTE — Telephone Encounter (Signed)
Patient called on call service, attempted to call back but no voicemail set up.

## 2020-11-08 NOTE — Patient Instructions (Signed)

## 2020-11-08 NOTE — Telephone Encounter (Signed)
Advised patient he may increase omeprazole to 40 mg from 20 mg once daily as he continues to have GERD.

## 2020-11-18 ENCOUNTER — Ambulatory Visit: Payer: BC Managed Care – PPO | Admitting: Student

## 2020-11-26 ENCOUNTER — Ambulatory Visit: Payer: BC Managed Care – PPO | Admitting: Cardiology

## 2020-12-19 ENCOUNTER — Other Ambulatory Visit: Payer: Self-pay | Admitting: Student

## 2020-12-19 DIAGNOSIS — R072 Precordial pain: Secondary | ICD-10-CM

## 2020-12-19 MED ORDER — METOPROLOL TARTRATE 25 MG PO TABS: ORAL_TABLET | ORAL | 0 refills | Status: AC

## 2020-12-19 NOTE — Progress Notes (Deleted)
Primary Physician/Referring:  Patient, No Pcp Per (Inactive)  Patient ID: Johnny Summers., male    DOB: Apr 06, 1985, 35 y.o.   MRN: [id number]  No chief complaint on file.   HPI:    Johnny Summers.  is a 35 y.o. AA male who presents to our office for evaluation of chest pain.  Patient was seen in [facility] emergency department 10/03/2020 following episodes of sharp chest pain lasting several seconds.  Evaluation in the emergency department revealed BMP, CBC within normal limits and high-sensitivity troponin negative x2.  Chest x-ray at that time was unremarkable and EKG revealed sinus bradycardia without acute changes.   Patient denies history of hypertension, hyperlipidemia, diabetes, family history of premature CAD.  He smokes 1/3 pack/day for the last 15 years.  Patient does have history of nonbleeding gastric ulcer in 2012.  Patient was last seen in our office 11/08/2020 with continued chest pain symptoms consistent with MSK etiology as well as GERD.  However exercise stress test revealed poor functional capacity and frequent ventricular ectopy during stress, therefore recommended coronary CTA.  Unfortunately coronary CTA has not been done***.  Since last office visit patient has followed up with PCP for management of musculoskeletal chest pain and GERD.  Review of PCP records reports patient's chest pain symptoms resolved following steroid pack and Tylenol.  Patient now presents for follow-up.***  ***  Patient was last seen in our office 10/07/2020 with symptoms suggestive of GERD as well as costochondritis.  At last office visit patient was started on omeprazole and advised to take Tylenol for musculoskeletal chest pain given history of gastric ulcer.  Although suspicion was low did obtain exercise stress test as well as lab testing.  A1c and TSH within normal limits.  Patient's LDL elevated at 117.  Patient's exercise treadmill stress test was intermediate risk due to poor functional  capacity and PVCs/ventricular couplets during stress.  Patient now presents for follow up.  Patient has discontinued Tylenol and is only taking omeprazole for the last 2 days.  He continues to complain of chest discomfort when he raises his arms as well as when lying flat.  Denies palpitations, syncope, near syncope, dizziness.  Patient has still not established with PCP.  Past Medical History:  Diagnosis Date   Stomach ulcer    Past Surgical History:  Procedure Laterality Date   UPPER GI ENDOSCOPY     Family History  Problem Relation Age of Onset   Cancer Father     Social History   Tobacco Use   Smoking status: Some Days    Packs/day: 0.25    Years: 15.00    Pack years: 3.75    Types: Cigarettes   Smokeless tobacco: Never  Substance Use Topics   Alcohol use: Not Currently    Alcohol/week: 1.0 standard drink    Types: 1 Shots of liquor per week    Comment: occasionally   Marital Status: Single   ROS  Review of Systems  Constitutional: Negative for malaise/fatigue.  Cardiovascular:  Positive for chest pain. Negative for claudication, leg swelling, near-syncope, orthopnea, palpitations, paroxysmal nocturnal dyspnea and syncope.  Respiratory:  Negative for shortness of breath.   Neurological:  Negative for dizziness.   Objective  There were no vitals taken for this visit.  Vitals with BMI 11/08/2020 10/07/2020 10/03/2020  Height 5\' 10"  5\' 10"  -  Weight 205 lbs 6 oz 207 lbs -  BMI 29.47 29.7 -  Systolic 122 138  Diastolic 79  77 93  Pulse 81 77 52      Physical Exam Vitals reviewed.  Constitutional:      Appearance: He is obese.  HENT:     Head: Normocephalic and atraumatic.  Cardiovascular:     Rate and Rhythm: Normal rate and regular rhythm.     Pulses: Intact distal pulses.          Carotid pulses are 2+ on the right side and 2+ on the left side.      Radial pulses are 2+ on the right side and 2+ on the left side.       Femoral pulses are 2+ on the right  side and 2+ on the left side.      Popliteal pulses are 2+ on the right side and 2+ on the left side.       Dorsalis pedis pulses are 2+ on the right side and 2+ on the left side.       Posterior tibial pulses are 2+ on the right side and 2+ on the left side.     Heart sounds: S1 normal and S2 normal. No murmur heard.   No gallop.  Pulmonary:     Effort: Pulmonary effort is normal.     Breath sounds: Normal breath sounds.  Chest:    Musculoskeletal:     Right lower leg: No edema.     Left lower leg: No edema.  Neurological:     Mental Status: He is alert.    Laboratory examination:   Recent Labs    10/03/20 1433  NA 138  K 3.9  CL 107  CO2 28  GLUCOSE 91  BUN 10  CREATININE 1.08  CALCIUM 9.1  GFRNONAA >60    CrCl cannot be calculated (Patient's most recent lab result is older than the maximum 21 days allowed.).  CMP Latest Ref Rng & Units 10/03/2020 10/02/2011  Glucose 70 - 99 mg/dL 91 87  BUN 6 - 20 mg/dL 10 10  Creatinine 1.76 - 1.24 mg/dL 1.60 7.37  Sodium 106 - 145 mmol/L 138 143  Potassium 3.5 - 5.1 mmol/L 3.9 3.7  Chloride 98 - 111 mmol/L 107 105  CO2 22 - 32 mmol/L 28 30  Calcium 8.9 - 10.3 mg/dL 9.1 8.9  Total Protein 6.4 - 8.2 g/dL - 7.3  Total Bilirubin 0.2 - 1.0 mg/dL - 0.5  Alkaline Phos 50 - 136 Unit/L - 111  AST 15 - 37 Unit/L - 23  ALT U/L - 20   CBC Latest Ref Rng & Units 10/03/2020 10/02/2011  WBC 4.0 - 10.5 K/uL 4.1 4.3  Hemoglobin 13.0 - 17.0 g/dL 26.9 48.5  Hematocrit 46.2 - 52.0 % 44.2 44.3  Platelets 150 - 400 K/uL 171 163    Lipid Panel Recent Labs    10/17/20 1235  CHOL 178  TRIG 87  LDLCALC 117*  HDL 45     HEMOGLOBIN A1C Lab Results  Component Value Date   HGBA1C 5.6 10/17/2020   TSH Recent Labs    10/17/20 1235  TSH 1.100     Sensitivity troponin 10/04/2018: 4--> 4  External labs:   None   Allergies   Allergies  Allergen Reactions   Doxycycline     Medications Prior to Visit:   Outpatient Medications  Prior to Visit  Medication Sig Dispense Refill   naproxen sodium (ALEVE) 220 MG tablet Take 220 mg by mouth.     omeprazole (PRILOSEC) 20 MG capsule Take 1 capsule (20  mg total) by mouth daily. 30 capsule 11   No facility-administered medications prior to visit.   Final Medications at End of Visit    No outpatient medications have been marked as taking for the 12/20/20 encounter (Appointment) with Carlynn Purl, Serge Main C, PA-C.   Radiology:   No results found. Chest x-ray 10/03/2020: Frontal and lateral views of the chest demonstrate an unremarkable cardiac silhouette. No airspace disease, effusion, or pneumothorax. No acute bony abnormalities. IMPRESSION: No acute intrathoracic process.  Cardiac Studies:   PCV CARDIAC STRESS TEST 10/22/2020 Functional status: Poor. Chest pain: No. Reason for stopping exercise: Fatigue/weakness. Hypertensive response to exercise: No. Exercise time 7 minutes 18 seconds on Bruce protocol, achieved 9.06METS, 94% of age-predicted maximum heart rate. Stress ECG negative for ischemia. Intermediate risk study - Poor functional capacity for age and PVCs / ventricular couplets noted during stress. Clinical correlation recommended.  EKG:   10/07/2020: Sinus rhythm with short PR interval at rate of 63 bpm, incompleteright bundle branch block. Otherwise normal EKG.  10/03/2020: Sinus bradycardia at a rate of 57 bpm.  Normal axis.  No evidence of ischemia or underlying injury pattern.  Assessment   No diagnosis found.    There are no discontinued medications.   No orders of the defined types were placed in this encounter.   Recommendations:   Johnny Summers. is a 35 y.o. AA male with history of active tobacco use, gastric ulcer and 2012.  Denies history of hypertension, hyperlipidemia, diabetes, family history of premature CAD.  Who presents for evaluation of chest pain after being seen in emergency department 10/03/2020.  Patient was last seen in our  office 10/07/2020 with symptoms suggestive of GERD as well as costochondritis.  At last office visit patient was started on omeprazole and advised to take Tylenol for musculoskeletal chest pain given history of gastric ulcer.  Although suspicion was low did obtain exercise stress test as well as lab testing.  A1c and TSH within normal limits.  Patient's LDL elevated at 117.  Patient's exercise treadmill stress test was intermediate risk due to poor functional capacity and PVCs/ventricular couplets during stress.  Patient now presents for follow up.  Patient continues to have chest pain and symptoms consistent with musculoskeletal etiology as well as GERD.  However reviewed and discussed with patient regarding results of exercise stress test.  Stress test revealed poor functional capacity and frequent utricular ectopy during stress.  Given stress test findings and risk factors including tobacco use and hyperlipidemia, as well as patient's continued chest pain discussed with patient option of further ischemic evaluation.  Shared decision was to proceed with coronary CTA.  Otherwise advised patient to continue omeprazole and Tylenol, patient has also tried naproxen.  Advised him to take naproxen with food if he chooses to take it, given history of gastric ulcer.  Also discussed at length with patient regarding the importance of diet and lifestyle modifications.  Advised him of the importance of establishing with PCP.  Follow-up in 6 to 8 weeks, sooner if needed, for precordial pain and results of cardiac testing.   Johnny Halsted, PA-C 12/19/2020, 9:48 AM Office: [phone]

## 2020-12-20 ENCOUNTER — Ambulatory Visit: Payer: BC Managed Care – PPO | Admitting: Student

## 2020-12-30 ENCOUNTER — Ambulatory Visit (HOSPITAL_COMMUNITY): Admission: RE | Admit: 2020-12-30 | Payer: BC Managed Care – PPO | Source: Ambulatory Visit

## 2021-01-02 ENCOUNTER — Telehealth (HOSPITAL_COMMUNITY): Payer: Self-pay | Admitting: Emergency Medicine

## 2021-01-02 NOTE — Telephone Encounter (Signed)
Unable to leave vm Fontaine Kossman RN Navigator Cardiac Imaging [facility] [phone] Office  [phone] Cell  

## 2021-01-03 ENCOUNTER — Encounter (HOSPITAL_COMMUNITY): Payer: Self-pay

## 2021-01-03 ENCOUNTER — Emergency Department (HOSPITAL_COMMUNITY): Payer: BC Managed Care – PPO

## 2021-01-03 ENCOUNTER — Emergency Department (HOSPITAL_COMMUNITY)
Admission: EM | Admit: 2021-01-03 | Discharge: 2021-01-04 | Discharge status: Against Medical Advice | Payer: BC Managed Care – PPO | Attending: Student | Admitting: Student

## 2021-01-03 ENCOUNTER — Other Ambulatory Visit: Payer: Self-pay

## 2021-01-03 ENCOUNTER — Telehealth (HOSPITAL_COMMUNITY): Payer: Self-pay | Admitting: Emergency Medicine

## 2021-01-03 DIAGNOSIS — R42 Dizziness and giddiness: Secondary | ICD-10-CM | POA: Diagnosis present

## 2021-01-03 DIAGNOSIS — Z5321 Procedure and treatment not carried out due to patient leaving prior to being seen by health care provider: Secondary | ICD-10-CM | POA: Insufficient documentation

## 2021-01-03 DIAGNOSIS — M79602 Pain in left arm: Secondary | ICD-10-CM | POA: Diagnosis not present

## 2021-01-03 LAB — CBC
HCT: 44.2 % (ref 39.0–52.0)
Hemoglobin: 15.3 g/dL (ref 13.0–17.0)
MCH: 30.3 pg (ref 26.0–34.0)
MCHC: 34.6 g/dL (ref 30.0–36.0)
MCV: 87.5 fL (ref 80.0–100.0)
Platelets: 207 10*3/uL (ref 150–400)
RBC: 5.05 MIL/uL (ref 4.22–5.81)
RDW: 12.2 % (ref 11.5–15.5)
WBC: 5.3 10*3/uL (ref 4.0–10.5)
nRBC: 0 % (ref 0.0–0.2)

## 2021-01-03 LAB — BASIC METABOLIC PANEL
Anion gap: 7 (ref 5–15)
BUN: 10 mg/dL (ref 6–20)
CO2: 26 mmol/L (ref 22–32)
Calcium: 8.9 mg/dL (ref 8.9–10.3)
Chloride: 101 mmol/L (ref 98–111)
Creatinine, Ser: 1.08 mg/dL (ref 0.61–1.24)
GFR, Estimated: >60 mL/min (ref >60)
Glucose, Bld: 92 mg/dL (ref 70–99)
Potassium: 3.9 mmol/L (ref 3.5–5.1)
Sodium: 134 mmol/L — ABNORMAL LOW (ref 135–145)

## 2021-01-03 LAB — TROPONIN I (HIGH SENSITIVITY): Troponin I (High Sensitivity): <2 ng/L (ref <18)

## 2021-01-03 NOTE — Telephone Encounter (Signed)
Reaching out to patient to offer assistance regarding upcoming cardiac imaging study; pt verbalizes understanding of appt date/time, parking situation and where to check in, pre-test NPO status and medications ordered, and verified current allergies; name and call back number provided for further questions should they arise Johnny Alexandria RN Navigator Cardiac Imaging [facility] [phone] office [phone] cell  Pt very worried about the test, taking medications, asking a lot of questions.  I tried to reassure him that this test is well planned and executed, that he is in good hands with a lot of support. Pt appreicated the information  25mg  metoprolol bid x 3 days prior to scan

## 2021-01-03 NOTE — ED Provider Notes (Signed)
Emergency Medicine Provider Triage Evaluation Note  Johnny Summers. , a 35 y.o. male  was evaluated in triage.  Pt complains of arm pain. Patient states he was at work when he developed sharp chest pain, dyspnea, dizziness, and left arm pain, had recently lifted something light with the LUE. Sxs fairly resolved now, only has mild LUE discomfort.   He reports recently being started on metoprolol BID, took first dose this AM and 2nd tonight shortly prior to sxs onset. Has stress test scheduled for this Monday.   Review of Systems  Positive: Chest pain dyspnea, and dizziness- resolved @ present, LUE pain Negative: Vomiting, nausea, diaphoresis, syncope, rash.   Physical Exam  BP 122/79   Pulse (!) 59   Temp 97.6 F (36.4 C) (Oral)   Resp 16   SpO2 99%  Gen:   Awake, no distress   Resp:  Normal effort  MSK:   Moves extremities without difficulty  Other:  Minimally bradycardic, regular rhythm, lungs CTA.  Symmetric pulses.   Medical Decision Making  Medically screening exam initiated at 10:56 PM.  Appropriate orders placed.  Johnny Summers. was informed that the remainder of the evaluation will be completed by another provider, this initial triage assessment does not replace that evaluation, and the importance of remaining in the ED until their evaluation is complete.  Labs, CXR & EKG ordered.   Left arm pain.    Cherly Anderson, PA-C 01/03/21 2300    Sloan Leiter, DO 01/03/21 2303

## 2021-01-03 NOTE — ED Triage Notes (Signed)
Pt reports that he has been having dizziness and L arm pain that has been going on for a few hours

## 2021-01-04 LAB — TROPONIN I (HIGH SENSITIVITY): Troponin I (High Sensitivity): 3 ng/L (ref <18)

## 2021-01-04 NOTE — ED Notes (Signed)
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 Pt encourage to stay, but refused.

## 2021-01-06 ENCOUNTER — Other Ambulatory Visit: Payer: Self-pay

## 2021-01-06 ENCOUNTER — Ambulatory Visit (HOSPITAL_COMMUNITY)
Admission: RE | Admit: 2021-01-06 | Discharge: 2021-01-06 | Disposition: A | Discharge status: Home / Self Care | Payer: BC Managed Care – PPO | Source: Ambulatory Visit | Attending: Student | Admitting: Student

## 2021-01-06 ENCOUNTER — Encounter (HOSPITAL_COMMUNITY): Payer: Self-pay

## 2021-01-06 DIAGNOSIS — E7800 Pure hypercholesterolemia, unspecified: Secondary | ICD-10-CM | POA: Insufficient documentation

## 2021-01-06 DIAGNOSIS — R072 Precordial pain: Secondary | ICD-10-CM | POA: Diagnosis present

## 2021-01-06 DIAGNOSIS — F17200 Nicotine dependence, unspecified, uncomplicated: Secondary | ICD-10-CM | POA: Insufficient documentation

## 2021-01-06 MED ORDER — NITROGLYCERIN 0.4 MG SL SUBL
0.8000 mg | SUBLINGUAL_TABLET | Freq: Once | SUBLINGUAL | Status: AC
  Administered 2021-01-06: 0.8 mg via SUBLINGUAL

## 2021-01-06 MED ORDER — NITROGLYCERIN 0.4 MG SL SUBL
SUBLINGUAL_TABLET | SUBLINGUAL | Status: AC
  Filled 2021-01-06: qty 2

## 2021-01-06 MED ORDER — IOHEXOL 350 MG/ML SOLN
95.0000 mL | Freq: Once | INTRAVENOUS | Status: AC | PRN
  Administered 2021-01-06: 95 mL via INTRAVENOUS

## 2021-01-07 ENCOUNTER — Ambulatory Visit: Payer: BC Managed Care – PPO | Admitting: Student

## 2021-01-07 NOTE — Telephone Encounter (Signed)
From pt

## 2021-01-08 DIAGNOSIS — R072 Precordial pain: Secondary | ICD-10-CM | POA: Insufficient documentation

## 2021-01-10 ENCOUNTER — Telehealth: Payer: Self-pay | Admitting: Student

## 2021-01-10 NOTE — Progress Notes (Signed)
Reviewed results with patient, he verbalized understanding.  Advised patient to follow-up with PCP for evaluation of noncardiac causes of chest pain.  Particularly GERD and musculoskeletal etiology.

## 2021-01-10 NOTE — Telephone Encounter (Signed)
Patient is requesting to speak with you when you are able to give him a call back.

## 2021-01-14 NOTE — Progress Notes (Deleted)
Primary Physician/Referring:  Jola Baptist, PA-C  Patient ID: Johnny Summers., male    DOB: 24-Aug-1985, 35 y.o.   MRN: [id number]  No chief complaint on file.   HPI:    Johnny Summers.  is a 35 y.o. AA male who presents to our office for evaluation of chest pain.  Patient was seen in [facility] emergency department 10/03/2020 following episodes of sharp chest pain lasting several seconds.  Evaluation in the emergency department revealed BMP, CBC within normal limits and high-sensitivity troponin negative x2.  Chest x-ray at that time was unremarkable and EKG revealed sinus bradycardia without acute changes.   Patient denies history of hypertension, hyperlipidemia, diabetes, family history of premature CAD.  He smokes 1/3 pack/day for the last 15 years.  Patient does have history of nonbleeding gastric ulcer in 2012.  Patient was last seen in our office 11/08/2020 with continued chest pain symptoms consistent with MSK etiology as well as GERD.  However exercise stress test revealed poor functional capacity and frequent ventricular ectopy during stress, therefore recommended coronary CTA.  Unfortunately coronary CTA has not been done***.  Since last office visit patient has followed up with PCP for management of musculoskeletal chest pain and GERD.  Review of PCP records reports patient's chest pain symptoms resolved following steroid pack and Tylenol.  Patient now presents for follow-up.***  ***  Patient was last seen in our office 10/07/2020 with symptoms suggestive of GERD as well as costochondritis.  At last office visit patient was started on omeprazole and advised to take Tylenol for musculoskeletal chest pain given history of gastric ulcer.  Although suspicion was low did obtain exercise stress test as well as lab testing.  A1c and TSH within normal limits.  Patient's LDL elevated at 117.  Patient's exercise treadmill stress test was intermediate risk due to poor functional capacity and  PVCs/ventricular couplets during stress.  Patient now presents for follow up.  Patient has discontinued Tylenol and is only taking omeprazole for the last 2 days.  He continues to complain of chest discomfort when he raises his arms as well as when lying flat.  Denies palpitations, syncope, near syncope, dizziness.  Patient has still not established with PCP.  Past Medical History:  Diagnosis Date   Stomach ulcer    Past Surgical History:  Procedure Laterality Date   UPPER GI ENDOSCOPY     Family History  Problem Relation Age of Onset   Cancer Father     Social History   Tobacco Use   Smoking status: Some Days    Packs/day: 0.25    Years: 15.00    Pack years: 3.75    Types: Cigarettes   Smokeless tobacco: Never  Substance Use Topics   Alcohol use: Not Currently    Alcohol/week: 1.0 standard drink    Types: 1 Shots of liquor per week    Comment: occasionally   Marital Status: Single   ROS  Review of Systems  Constitutional: Negative for malaise/fatigue.  Cardiovascular:  Positive for chest pain. Negative for claudication, leg swelling, near-syncope, orthopnea, palpitations, paroxysmal nocturnal dyspnea and syncope.  Respiratory:  Negative for shortness of breath.   Neurological:  Negative for dizziness.   Objective  There were no vitals taken for this visit.  Vitals with BMI 01/06/2021 01/06/2021 01/04/2021  Height - - -  Weight - - -  BMI - - -  Systolic [id number]  Diastolic 62 70 89  Pulse 60 58 56  Physical Exam Vitals reviewed.  Constitutional:      Appearance: He is obese.  HENT:     Head: Normocephalic and atraumatic.  Cardiovascular:     Rate and Rhythm: Normal rate and regular rhythm.     Pulses: Intact distal pulses.          Carotid pulses are 2+ on the right side and 2+ on the left side.      Radial pulses are 2+ on the right side and 2+ on the left side.       Femoral pulses are 2+ on the right side and 2+ on the left side.      Popliteal  pulses are 2+ on the right side and 2+ on the left side.       Dorsalis pedis pulses are 2+ on the right side and 2+ on the left side.       Posterior tibial pulses are 2+ on the right side and 2+ on the left side.     Heart sounds: S1 normal and S2 normal. No murmur heard.   No gallop.  Pulmonary:     Effort: Pulmonary effort is normal.     Breath sounds: Normal breath sounds.  Chest:    Musculoskeletal:     Right lower leg: No edema.     Left lower leg: No edema.  Neurological:     Mental Status: He is alert.    Laboratory examination:   Recent Labs    10/03/20 1433 01/03/21 2307  NA 138 134*  K 3.9 3.9  CL 107 101  CO2 28 26  GLUCOSE 91 92  BUN 10 10  CREATININE 1.08 1.08  CALCIUM 9.1 8.9  GFRNONAA >60 >60    CrCl cannot be calculated (Unknown ideal weight.).  CMP Latest Ref Rng & Units 01/03/2021 10/03/2020 10/02/2011  Glucose 70 - 99 mg/dL 92 91 87  BUN 6 - 20 mg/dL 10 10 10   Creatinine 0.61 - 1.24 mg/dL 1.08 1.08 1.11  Sodium 135 - 145 mmol/L 134(L) 138 143  Potassium 3.5 - 5.1 mmol/L 3.9 3.9 3.7  Chloride 98 - 111 mmol/L 101 107 105  CO2 22 - 32 mmol/L 26 28 30   Calcium 8.9 - 10.3 mg/dL 8.9 9.1 8.9  Total Protein 6.4 - 8.2 g/dL - - 7.3  Total Bilirubin 0.2 - 1.0 mg/dL - - 0.5  Alkaline Phos 50 - 136 Unit/L - - 111  AST 15 - 37 Unit/L - - 23  ALT U/L - - 20   CBC Latest Ref Rng & Units 01/03/2021 10/03/2020 10/02/2011  WBC 4.0 - 10.5 K/uL 5.3 4.1 4.3  Hemoglobin 13.0 - 17.0 g/dL 15.3 14.8 15.1  Hematocrit 39.0 - 52.0 % 44.2 44.2 44.3  Platelets 150 - 400 K/uL 207 171 163    Lipid Panel Recent Labs    10/17/20 1235  CHOL 178  TRIG 87  LDLCALC 117*  HDL 45     HEMOGLOBIN A1C Lab Results  Component Value Date   HGBA1C 5.6 10/17/2020   TSH Recent Labs    10/17/20 1235  TSH 1.100     Sensitivity troponin 10/04/2018: 4--> 4  External labs:   None   Allergies   Allergies  Allergen Reactions   Doxycycline     Medications Prior to Visit:    Outpatient Medications Prior to Visit  Medication Sig Dispense Refill   metoprolol tartrate (LOPRESSOR) 25 MG tablet Take 1 tablet 2 times daily for 3 days prior  to CTA and 1 tablet once the morning of CTA. 7 tablet 0   naproxen sodium (ALEVE) 220 MG tablet Take 220 mg by mouth.     omeprazole (PRILOSEC) 20 MG capsule Take 1 capsule (20 mg total) by mouth daily. 30 capsule 11   No facility-administered medications prior to visit.   Final Medications at End of Visit    No outpatient medications have been marked as taking for the 01/15/21 encounter (Appointment) with Carlynn Purl, Virgel Haro C, PA-C.   Radiology:   No results found. Chest x-ray 10/03/2020: Frontal and lateral views of the chest demonstrate an unremarkable cardiac silhouette. No airspace disease, effusion, or pneumothorax. No acute bony abnormalities. IMPRESSION: No acute intrathoracic process.  Cardiac Studies:   PCV CARDIAC STRESS TEST 10/22/2020 Functional status: Poor. Chest pain: No. Reason for stopping exercise: Fatigue/weakness. Hypertensive response to exercise: No. Exercise time 7 minutes 18 seconds on Bruce protocol, achieved 9.06METS, 94% of age-predicted maximum heart rate. Stress ECG negative for ischemia. Intermediate risk study - Poor functional capacity for age and PVCs / ventricular couplets noted during stress. Clinical correlation recommended.  EKG:   10/07/2020: Sinus rhythm with short PR interval at rate of 63 bpm, incompleteright bundle branch block. Otherwise normal EKG.  10/03/2020: Sinus bradycardia at a rate of 57 bpm.  Normal axis.  No evidence of ischemia or underlying injury pattern.  Assessment   No diagnosis found.    There are no discontinued medications.   No orders of the defined types were placed in this encounter.   Recommendations:   Johnny Majano. is a 35 y.o. AA male with history of active tobacco use, gastric ulcer and 2012.  Denies history of hypertension,  hyperlipidemia, diabetes, family history of premature CAD.  Who presents for evaluation of chest pain after being seen in emergency department 10/03/2020.  Patient was last seen in our office 10/07/2020 with symptoms suggestive of GERD as well as costochondritis.  At last office visit patient was started on omeprazole and advised to take Tylenol for musculoskeletal chest pain given history of gastric ulcer.  Although suspicion was low did obtain exercise stress test as well as lab testing.  A1c and TSH within normal limits.  Patient's LDL elevated at 117.  Patient's exercise treadmill stress test was intermediate risk due to poor functional capacity and PVCs/ventricular couplets during stress.  Patient now presents for follow up.  Patient continues to have chest pain and symptoms consistent with musculoskeletal etiology as well as GERD.  However reviewed and discussed with patient regarding results of exercise stress test.  Stress test revealed poor functional capacity and frequent utricular ectopy during stress.  Given stress test findings and risk factors including tobacco use and hyperlipidemia, as well as patient's continued chest pain discussed with patient option of further ischemic evaluation.  Shared decision was to proceed with coronary CTA.  Otherwise advised patient to continue omeprazole and Tylenol, patient has also tried naproxen.  Advised him to take naproxen with food if he chooses to take it, given history of gastric ulcer.  Also discussed at length with patient regarding the importance of diet and lifestyle modifications.  Advised him of the importance of establishing with PCP.  Follow-up in 6 to 8 weeks, sooner if needed, for precordial pain and results of cardiac testing.   Johnny Halsted, PA-C 01/14/2021, 2:05 PM Office: [phone]

## 2021-01-15 ENCOUNTER — Ambulatory Visit: Payer: BC Managed Care – PPO | Admitting: Student

## 2021-01-15 DIAGNOSIS — R072 Precordial pain: Secondary | ICD-10-CM

## 2021-02-25 ENCOUNTER — Emergency Department (HOSPITAL_COMMUNITY)
Admission: EM | Admit: 2021-02-25 | Discharge: 2021-02-25 | Discharge status: Against Medical Advice | Payer: BC Managed Care – PPO | Attending: Emergency Medicine | Admitting: Emergency Medicine

## 2021-02-25 ENCOUNTER — Other Ambulatory Visit: Payer: Self-pay

## 2021-02-25 ENCOUNTER — Encounter (HOSPITAL_COMMUNITY): Payer: Self-pay

## 2021-02-25 DIAGNOSIS — R6883 Chills (without fever): Secondary | ICD-10-CM | POA: Insufficient documentation

## 2021-02-25 DIAGNOSIS — Z5321 Procedure and treatment not carried out due to patient leaving prior to being seen by health care provider: Secondary | ICD-10-CM | POA: Diagnosis not present

## 2021-02-25 NOTE — ED Triage Notes (Addendum)
Patient complaining of chills for 3 days and wants to be checked for COVID.

## 2023-08-16 IMAGING — CT CT HEART MORP W/ CTA COR W/ SCORE W/ CA W/CM &/OR W/O CM
4 of 7 series · 8 of 20 positions shown, 9 images · IV contrast (APPLIED)
Comparison: 01/03/2021 chest radiograph.
COMPARISON: 01/03/2021 chest radiograph.

Addendum:
EXAM:
OVER-READ INTERPRETATION  CT CHEST

The following report is an over-read performed by radiologist Dr.
Gofah Proverbs [REDACTED] on 01/06/2021. This over-read
does not include interpretation of cardiac or coronary anatomy or
pathology. The coronary CTA interpretation by the cardiologist is
attached.
HISTORY: Chest pain/anginal equiv, ECGs and troponins normal
Cardiac/Coronary  CT
TECHNIQUE: The patient was scanned on a Siemens Force scanner.
PROTOCOL: A 120 kV prospective scan was triggered in the descending thoracic
aorta at 111 HU's. Axial non-contrast 3 mm slices were carried out
through the heart. The data set was analyzed on a dedicated work
station and scored using the Agatson method. Gantry rotation speed
was 250 msecs and collimation was .6 mm. No IV beta blockade but
mg of sl NTG was given. The 3D data set was reconstructed in 5%
intervals of the 67-82 % of the R-R cycle. Diastolic phases were
analyzed on a dedicated work station using MPR, MIP and VRT modes.
The patient received 95mL OMNIPAQUE IOHEXOL 350 MG/ML SOLN of
contrast.

[Series 6: best diast 72 % · axial · 0.39mm/px · z∈[+1227,+1269]mm · 2 of 318 slices shown, 3 images]
[im 106/318  vessel]
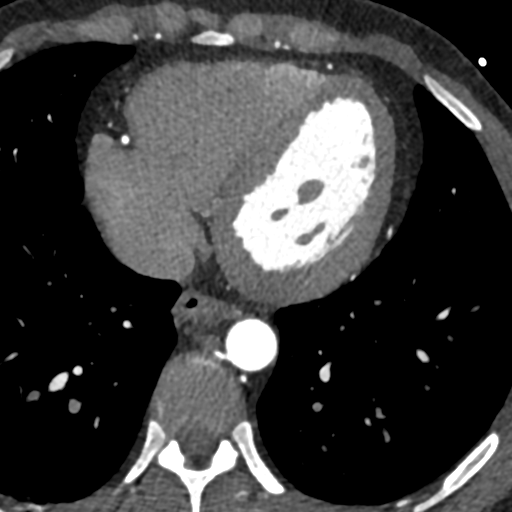
[im 106/318  lung]
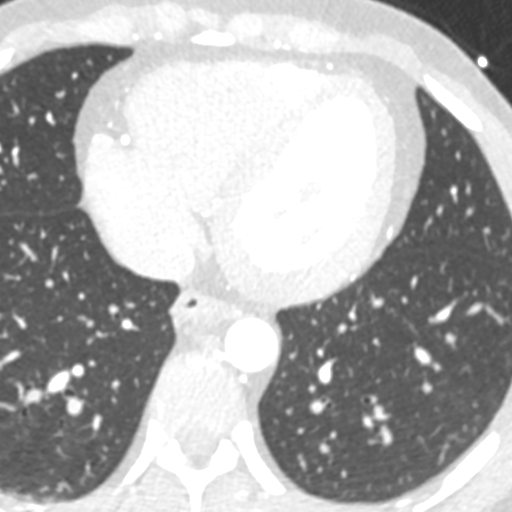
[im 212/318  vessel]
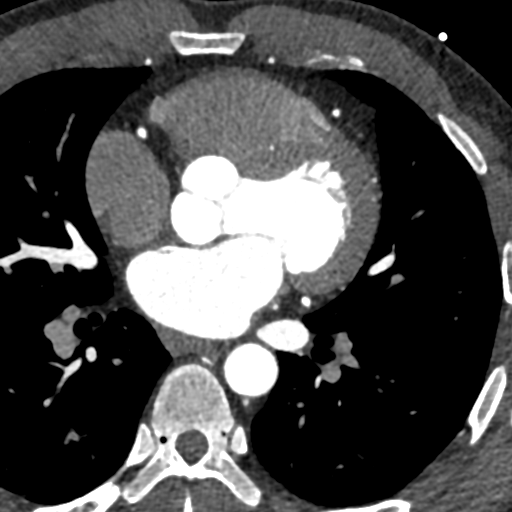

[Series 7: best syst 34 % · axial · 0.39mm/px · z∈[+1227,+1269]mm · 2 of 318 slices shown]
[im 106/318  vessel]
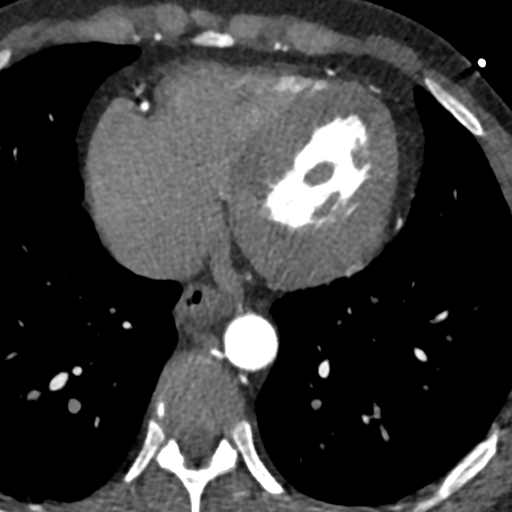
[im 212/318  vessel]
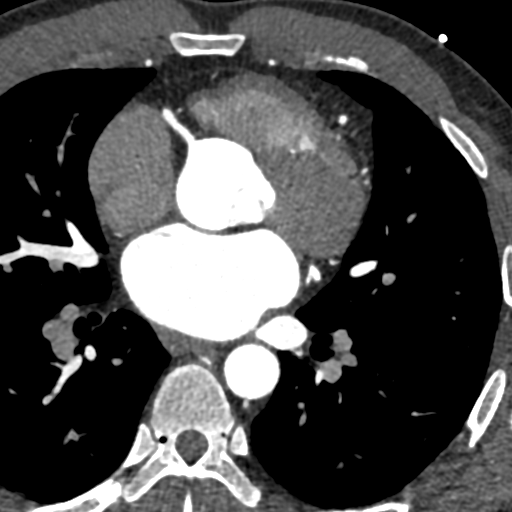

[Series 8: ts diast sharp 72 % · axial · 0.39mm/px · z∈[+1227,+1269]mm · 2 of 318 slices shown]
[im 106/318  lung]
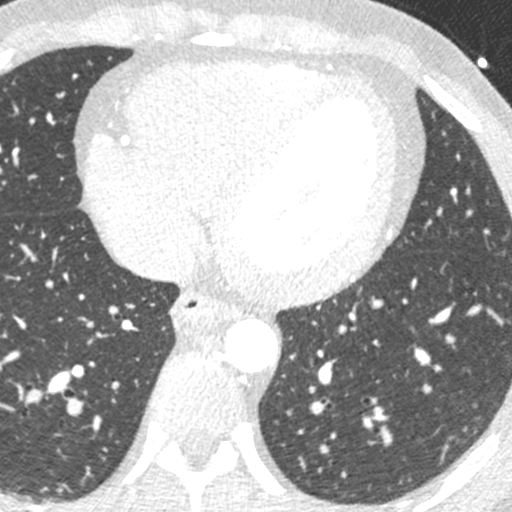
[im 212/318  lung]
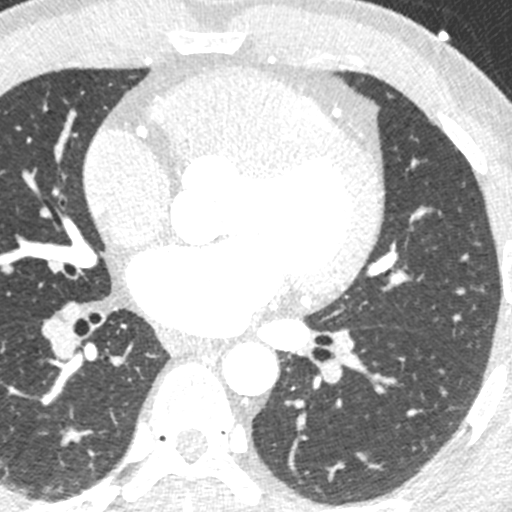

[Series 9: ts syst sharp 34 % · axial · 0.39mm/px · z∈[+1227,+1269]mm · 2 of 318 slices shown]
[im 106/318  lung]
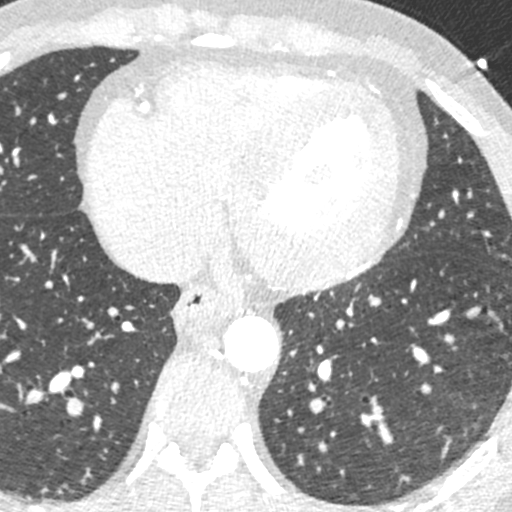
[im 212/318  lung]
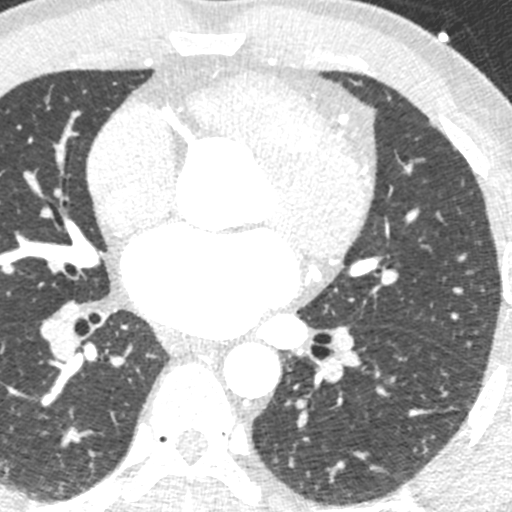

[8 of 20 positions shown; findings below may reference images not displayed]

FINDINGS: Vascular: Normal aortic caliber. No central pulmonary embolism, on
this non-dedicated study.

Mediastinum/Nodes: No imaged thoracic adenopathy. Residual thymic
tissue in the anterior mediastinum.

Lungs/Pleura: No pleural fluid.  Clear imaged lungs.

Upper Abdomen: Normal imaged portions of the liver, spleen, stomach.

Musculoskeletal: No acute osseous abnormality.
IMPRESSION: No acute findings in the imaged extracardiac chest.
FINDINGS: Image quality: Average.

Artifact: Limited.

Total coronary calcium score of 0.

Coronary arteries: Normal coronary origins.  Right dominance.

Left Main Coronary Artery: The left main is a normal caliber vessel,
short in length, normal take off from the left coronary cusp that
bifurcates to form a left anterior descending artery and a left
circumflex artery. There is no plaque or stenosis.

Left Anterior Descending Coronary Artery: Normal caliber vessel,
wraps the apex, gives off 4 patent diagonal branches. The LAD is
patent without evidence of plaque or stenosis. First and third
diagonal branches are normal caliber and patent. Second and fourth
diagonal branches are smaller in size but patent.

Left Circumflex Artery: Normal caliber vessel, non-dominant, travels
within the atrioventricular groove, gives off 3 patent obtuse
marginal branches. The LCX is patent with no evidence of plaque or
stenosis. Third obtuse marginal is larger in size with superior and
inferior branches.

Right Coronary Artery: The RCA is dominant with normal take off from
the right coronary cusp. The RCA terminates as a PDA and right
posterolateral branch without evidence of plaque or stenosis.

Left Atrium: Grossly normal in size with no left atrial appendage
filling defect.

Left Ventricle: Grossly normal in size. There are no stigmata of
prior infarction. There is no abnormal filling defect.

Pulmonary arteries: Normal in size without proximal filling defect.

Pulmonary veins: Normal pulmonary venous drainage.

Aorta: Normal size, 28.5 mm at the mid ascending aorta (level of the
PA bifurcation) measured double oblique. No calcifications. No
dissection.

Pericardium: Normal thickness with no significant effusion or
calcium present.

Cardiac valves: The aortic valve is trileaflet without
calcification. The mitral valve is normal structure without
calcification.

Extra-cardiac findings: See attached radiology report for
non-cardiac structures.
IMPRESSION: 1. Total coronary calcium score of 0.

2. Normal coronary origin with right dominance.

3. CAD-RADS = 0 No evidence of CAD (0%).

RECOMMENDATIONS:

Consider non-atherosclerotic causes of chest pain.

*** End of Addendum ***
EXAM:
OVER-READ INTERPRETATION  CT CHEST

The following report is an over-read performed by radiologist Dr.
Gofah Proverbs [REDACTED] on 01/06/2021. This over-read
does not include interpretation of cardiac or coronary anatomy or
pathology. The coronary CTA interpretation by the cardiologist is
attached.
FINDINGS: Vascular: Normal aortic caliber. No central pulmonary embolism, on
this non-dedicated study.

Mediastinum/Nodes: No imaged thoracic adenopathy. Residual thymic
tissue in the anterior mediastinum.

Lungs/Pleura: No pleural fluid.  Clear imaged lungs.

Upper Abdomen: Normal imaged portions of the liver, spleen, stomach.

Musculoskeletal: No acute osseous abnormality.
IMPRESSION: No acute findings in the imaged extracardiac chest.
